# Patient Record
Sex: Male | Born: 1970
Health system: Southern US, Community
[De-identification: ages and names within clinical notes are randomized; demographics above are authoritative.]

## PROBLEM LIST (undated history)

## (undated) DIAGNOSIS — I10 Essential (primary) hypertension: Secondary | ICD-10-CM

## (undated) DIAGNOSIS — J45909 Unspecified asthma, uncomplicated: Secondary | ICD-10-CM

## (undated) DIAGNOSIS — K649 Unspecified hemorrhoids: Secondary | ICD-10-CM

---

## 2013-09-14 ENCOUNTER — Emergency Department (HOSPITAL_BASED_OUTPATIENT_CLINIC_OR_DEPARTMENT_OTHER)
Admission: EM | Admit: 2013-09-14 | Discharge: 2013-09-14 | Disposition: A | Discharge status: Home / Self Care | Payer: Self-pay | Attending: Emergency Medicine | Admitting: Emergency Medicine

## 2013-09-14 ENCOUNTER — Encounter (HOSPITAL_BASED_OUTPATIENT_CLINIC_OR_DEPARTMENT_OTHER): Payer: Self-pay | Admitting: Emergency Medicine

## 2013-09-14 DIAGNOSIS — Y92009 Unspecified place in unspecified non-institutional (private) residence as the place of occurrence of the external cause: Secondary | ICD-10-CM | POA: Insufficient documentation

## 2013-09-14 DIAGNOSIS — T1590XA Foreign body on external eye, part unspecified, unspecified eye, initial encounter: Secondary | ICD-10-CM | POA: Insufficient documentation

## 2013-09-14 DIAGNOSIS — Y9389 Activity, other specified: Secondary | ICD-10-CM | POA: Insufficient documentation

## 2013-09-14 DIAGNOSIS — Z880 Allergy status to penicillin: Secondary | ICD-10-CM | POA: Insufficient documentation

## 2013-09-14 DIAGNOSIS — H109 Unspecified conjunctivitis: Secondary | ICD-10-CM | POA: Insufficient documentation

## 2013-09-14 DIAGNOSIS — F17200 Nicotine dependence, unspecified, uncomplicated: Secondary | ICD-10-CM | POA: Insufficient documentation

## 2013-09-14 MED ORDER — POLYETHYL GLYCOL-PROPYL GLYCOL 0.4-0.3 % OP SOLN: 1.0000 [drp] | Freq: Four times a day (QID) | OPHTHALMIC | Status: DC | PRN

## 2013-09-14 MED ORDER — FLUORESCEIN SODIUM 1 MG OP STRP
1.0000 | ORAL_STRIP | Freq: Once | OPHTHALMIC | Status: AC
  Administered 2013-09-14: 1 via OPHTHALMIC

## 2013-09-14 MED ORDER — FLUORESCEIN SODIUM 1 MG OP STRP
ORAL_STRIP | OPHTHALMIC | Status: AC
  Administered 2013-09-14: 1 via OPHTHALMIC
  Filled 2013-09-14: qty 1

## 2013-09-14 MED ORDER — TETRACAINE HCL 0.5 % OP SOLN
OPHTHALMIC | Status: AC
  Administered 2013-09-14: 1 [drp] via OPHTHALMIC
  Filled 2013-09-14: qty 2

## 2013-09-14 MED ORDER — TETRACAINE HCL 0.5 % OP SOLN
1.0000 [drp] | Freq: Once | OPHTHALMIC | Status: AC
  Administered 2013-09-14: 1 [drp] via OPHTHALMIC

## 2013-09-14 NOTE — ED Notes (Signed)
Pt having left eye irritation x 2 days.  Pt states something was in his eye yesterday and still feels like it is present.  Noted redness and irritation.  No blurry vision at this time.

## 2013-09-14 NOTE — ED Provider Notes (Signed)
I saw and evaluated the patient, reviewed the resident's note and I agree with the findings and plan.  EKG Interpretation   None       Pt is a 42 y.o. male with no significant past medical history presents emergency department with left eye irritation that started yesterday. He states that on Friday, 3 days ago he was working and got dust in his eye. He did not notice any discomfort until yesterday and his eye became red and began watering. No purulent discharge. No loss of vision, blurry vision. No headache or vomiting. No other history of trauma to the eye. On exam, patient has conjunctival irritation of both eyes, worse on the left. Extraocular movements intact and painless. Pupils are equal reactive bilaterally. No pain with consensual light response. Visual fields normal. Vision is 20/20 in both eyes. No corneal abrasion seen with fluorescein staining.  Negative Siedel's.  Patient did have 0.5 mm papule to his left lower eyelid on the inside of the eyelid. I was able to express this lesion using a Q-tip and there was a small amount of sebaceous-like drainage. After this lesion had resolved, patient reports his symptoms have completely resolved. No other foreign body appreciated with a version of eyelids. Patient likely has allergic conjunctivitis. No concern for globe injury, glaucoma, retinal detachment.  We'll discharge home with Artificial Tears, ophthalmology followup as needed, return precautions. Patient verbalized understanding and is comfortable plan.  Layla Maw Ward, DO 09/14/13 1106

## 2013-09-14 NOTE — Discharge Instructions (Signed)

## 2013-09-14 NOTE — ED Provider Notes (Signed)
CSN: [id number]     Arrival date & time 09/14/13  1011 History   First MD Initiated Contact with Patient 09/14/13 1018     Chief Complaint  Patient presents with  . Foreign Body in Eye   (Consider location/radiation/quality/duration/timing/severity/associated sxs/prior Treatment) Patient is a 42 y.o. male presenting with foreign body in eye. The history is provided by the patient.  Foreign Body in Eye This is a new problem. The current episode started in the past 7 days. The problem occurs constantly. The problem has been unchanged. Pertinent negatives include no abdominal pain, chills, coughing, diaphoresis, fatigue, fever, headaches, neck pain, rash, sore throat or vomiting. Nothing aggravates the symptoms. He has tried nothing for the symptoms. The treatment provided no relief.   Patient reports that two days ago he was working at home when he got dust in his eye. Last night he started to have an itching sensation and tearing. He denies visual disturbance, glare, blurry vision. He has no photophobia. No eyelid swelling or pain.  History reviewed. No pertinent past medical history. History reviewed. No pertinent past surgical history. History reviewed. No pertinent family history. History  Substance Use Topics  . Smoking status: Current Some Day Smoker    Types: Cigars  . Smokeless tobacco: Not on file  . Alcohol Use: Not on file    Review of Systems  Constitutional: Negative for fever, chills, diaphoresis and fatigue.  HENT: Negative for sore throat.   Respiratory: Negative for cough.   Gastrointestinal: Negative for vomiting and abdominal pain.  Musculoskeletal: Negative for neck pain.  Skin: Negative for rash.  Neurological: Negative for headaches.    Allergies  Penicillins  Home Medications  No current outpatient prescriptions on file. BP 146/84  Pulse 65  Temp(Src) 97.7 F (36.5 C) (Oral)  Resp 20  Ht 6' 3.5" (1.918 m)  Wt 195 lb (88.451 kg)  BMI 24.04 kg/m2   SpO2 100% Physical Exam  Eyes: Pupils are equal, round, and reactive to light. Lids are everted and swept, no foreign bodies found. Right eye exhibits no discharge, no exudate and no hordeolum. No foreign body present in the right eye. Left eye exhibits discharge. Left eye exhibits no chemosis, no exudate and no hordeolum. No foreign body present in the left eye. Right conjunctiva is not injected. Right conjunctiva has no hemorrhage. Left conjunctiva is injected. Left conjunctiva has no hemorrhage. No scleral icterus. Right eye exhibits normal extraocular motion and no nystagmus. Left eye exhibits normal extraocular motion and no nystagmus. Right pupil is round and reactive. Left pupil is round and reactive. Pupils are equal.  Slit lamp exam:      The right eye shows no corneal abrasion, no corneal flare, no corneal ulcer, no foreign body, no hyphema and no hypopyon.       The left eye shows no corneal abrasion, no corneal flare, no corneal ulcer, no foreign body, no hyphema, no hypopyon and no fluorescein uptake.    Small yellow spot on inside of lower eyelid approximately 1.5 mm from the margin and about .5 mm diameter.    ED Course  Procedures (including critical care time) Labs Review Labs Reviewed - No data to display Imaging Review No results found.  EKG Interpretation   None       MDM   1. Conjunctivitis of left eye     1. Conjunctivitis The patients eye itchiness and tearing appear to be secondary to be allergic conjunctivitis that had resutled after dust exposure (  foreign body). There is a 0.5 mm yellow pustule on the inside of the lower lid that was cleared by cue tip with much improvement in the patients symptoms. Recommend patient to use preservative free artificial tears qid prn. Recommend the patient f/u with eye doctor if new or worsening symptoms.    Pleas Koch, MD 09/14/13 1115

## 2014-07-12 ENCOUNTER — Encounter (HOSPITAL_BASED_OUTPATIENT_CLINIC_OR_DEPARTMENT_OTHER): Payer: Self-pay | Admitting: Emergency Medicine

## 2014-07-12 ENCOUNTER — Emergency Department (HOSPITAL_BASED_OUTPATIENT_CLINIC_OR_DEPARTMENT_OTHER)
Admission: EM | Admit: 2014-07-12 | Discharge: 2014-07-13 | Disposition: A | Discharge status: Home / Self Care | Payer: No Typology Code available for payment source | Attending: Emergency Medicine | Admitting: Emergency Medicine

## 2014-07-12 DIAGNOSIS — F17200 Nicotine dependence, unspecified, uncomplicated: Secondary | ICD-10-CM | POA: Insufficient documentation

## 2014-07-12 DIAGNOSIS — S161XXA Strain of muscle, fascia and tendon at neck level, initial encounter: Secondary | ICD-10-CM

## 2014-07-12 DIAGNOSIS — S139XXA Sprain of joints and ligaments of unspecified parts of neck, initial encounter: Secondary | ICD-10-CM | POA: Diagnosis not present

## 2014-07-12 DIAGNOSIS — Y92410 Unspecified street and highway as the place of occurrence of the external cause: Secondary | ICD-10-CM | POA: Insufficient documentation

## 2014-07-12 DIAGNOSIS — Z880 Allergy status to penicillin: Secondary | ICD-10-CM | POA: Diagnosis not present

## 2014-07-12 DIAGNOSIS — S199XXA Unspecified injury of neck, initial encounter: Secondary | ICD-10-CM | POA: Diagnosis present

## 2014-07-12 DIAGNOSIS — S0993XA Unspecified injury of face, initial encounter: Secondary | ICD-10-CM | POA: Diagnosis present

## 2014-07-12 DIAGNOSIS — Y9389 Activity, other specified: Secondary | ICD-10-CM | POA: Insufficient documentation
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
## 2014-07-13 ENCOUNTER — Emergency Department (HOSPITAL_BASED_OUTPATIENT_CLINIC_OR_DEPARTMENT_OTHER): Payer: No Typology Code available for payment source

## 2014-07-13 MED ORDER — HYDROCODONE-ACETAMINOPHEN 5-325 MG PO TABS: 1.0000 | ORAL_TABLET | Freq: Four times a day (QID) | ORAL | Status: DC | PRN

## 2014-07-13 MED ORDER — CYCLOBENZAPRINE HCL 10 MG PO TABS: 10.0000 mg | ORAL_TABLET | Freq: Three times a day (TID) | ORAL | Status: DC | PRN

## 2015-02-26 ENCOUNTER — Encounter (HOSPITAL_BASED_OUTPATIENT_CLINIC_OR_DEPARTMENT_OTHER): Payer: Self-pay | Admitting: *Deleted

## 2015-02-26 ENCOUNTER — Emergency Department (HOSPITAL_BASED_OUTPATIENT_CLINIC_OR_DEPARTMENT_OTHER)
Admission: EM | Admit: 2015-02-26 | Discharge: 2015-02-26 | Disposition: A | Discharge status: Home / Self Care | Payer: 59 | Attending: Emergency Medicine | Admitting: Emergency Medicine

## 2015-02-26 DIAGNOSIS — T148 Other injury of unspecified body region: Secondary | ICD-10-CM | POA: Insufficient documentation

## 2015-02-26 DIAGNOSIS — S4992XA Unspecified injury of left shoulder and upper arm, initial encounter: Secondary | ICD-10-CM | POA: Insufficient documentation

## 2015-02-26 DIAGNOSIS — S3992XA Unspecified injury of lower back, initial encounter: Secondary | ICD-10-CM | POA: Diagnosis not present

## 2015-02-26 DIAGNOSIS — S0990XA Unspecified injury of head, initial encounter: Secondary | ICD-10-CM | POA: Diagnosis present

## 2015-02-26 DIAGNOSIS — T148XXA Other injury of unspecified body region, initial encounter: Secondary | ICD-10-CM

## 2015-02-26 DIAGNOSIS — Z720 Tobacco use: Secondary | ICD-10-CM | POA: Diagnosis not present

## 2015-02-26 DIAGNOSIS — Y9389 Activity, other specified: Secondary | ICD-10-CM | POA: Diagnosis not present

## 2015-02-26 DIAGNOSIS — S199XXA Unspecified injury of neck, initial encounter: Secondary | ICD-10-CM | POA: Insufficient documentation

## 2015-02-26 DIAGNOSIS — Z880 Allergy status to penicillin: Secondary | ICD-10-CM | POA: Diagnosis not present

## 2015-02-26 DIAGNOSIS — Y998 Other external cause status: Secondary | ICD-10-CM | POA: Insufficient documentation

## 2015-02-26 DIAGNOSIS — Y92410 Unspecified street and highway as the place of occurrence of the external cause: Secondary | ICD-10-CM | POA: Insufficient documentation

## 2015-02-26 MED ORDER — NAPROXEN 500 MG PO TABS: 500.0000 mg | ORAL_TABLET | Freq: Two times a day (BID) | ORAL | Status: DC

## 2015-02-26 MED ORDER — IBUPROFEN 800 MG PO TABS
800.0000 mg | ORAL_TABLET | Freq: Once | ORAL | Status: AC
  Administered 2015-02-26: 800 mg via ORAL
  Filled 2015-02-26: qty 1

## 2015-02-26 MED ORDER — CYCLOBENZAPRINE HCL 10 MG PO TABS
10.0000 mg | ORAL_TABLET | Freq: Once | ORAL | Status: AC
  Administered 2015-02-26: 10 mg via ORAL
  Filled 2015-02-26: qty 1

## 2015-02-26 MED ORDER — CYCLOBENZAPRINE HCL 10 MG PO TABS: 10.0000 mg | ORAL_TABLET | Freq: Two times a day (BID) | ORAL | Status: DC | PRN
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
## 2015-02-26 NOTE — Discharge Instructions (Signed)
Contusion °A contusion is a deep bruise. Contusions happen when an injury causes bleeding under the skin. Signs of bruising include pain, puffiness (swelling), and discolored skin. The contusion may turn blue, purple, or yellow. °HOME CARE  °· Put ice on the injured area. °¨ Put ice in a plastic bag. °¨ Place a towel between your skin and the bag. °¨ Leave the ice on for 15-20 minutes, 03-04 times a day. °· Only take medicine as told by your doctor. °· Rest the injured area. °· If possible, raise (elevate) the injured area to lessen puffiness. °GET HELP RIGHT AWAY IF:  °· You have more bruising or puffiness. °· You have pain that is getting worse. °· Your puffiness or pain is not helped by medicine. °MAKE SURE YOU:  °· Understand these instructions. °· Will watch your condition. °· Will get help right away if you are not doing well or get worse. °Document Released: 04/10/2008 Document Revised: 01/15/2012 Document Reviewed: 08/28/2011 °[employer]® Patient Information ©2015 [employer]. This information is not intended to replace advice given to you by your health care provider. Make sure you discuss any questions you have with your health care provider. ° °
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
## 2015-04-07 ENCOUNTER — Telehealth (HOSPITAL_BASED_OUTPATIENT_CLINIC_OR_DEPARTMENT_OTHER): Payer: Self-pay

## 2015-04-07 ENCOUNTER — Encounter (HOSPITAL_BASED_OUTPATIENT_CLINIC_OR_DEPARTMENT_OTHER): Payer: Self-pay | Admitting: Emergency Medicine

## 2015-04-07 ENCOUNTER — Telehealth: Payer: Self-pay | Admitting: Emergency Medicine

## 2015-04-07 ENCOUNTER — Emergency Department (HOSPITAL_BASED_OUTPATIENT_CLINIC_OR_DEPARTMENT_OTHER)
Admission: EM | Admit: 2015-04-07 | Discharge: 2015-04-07 | Disposition: A | Discharge status: Home / Self Care | Payer: 59 | Attending: Emergency Medicine | Admitting: Emergency Medicine

## 2015-04-07 DIAGNOSIS — Z791 Long term (current) use of non-steroidal anti-inflammatories (NSAID): Secondary | ICD-10-CM | POA: Diagnosis not present

## 2015-04-07 DIAGNOSIS — K6289 Other specified diseases of anus and rectum: Secondary | ICD-10-CM | POA: Diagnosis present

## 2015-04-07 DIAGNOSIS — Z79899 Other long term (current) drug therapy: Secondary | ICD-10-CM | POA: Diagnosis not present

## 2015-04-07 DIAGNOSIS — K642 Third degree hemorrhoids: Secondary | ICD-10-CM | POA: Diagnosis not present

## 2015-04-07 DIAGNOSIS — Z880 Allergy status to penicillin: Secondary | ICD-10-CM | POA: Insufficient documentation

## 2015-04-07 DIAGNOSIS — Z720 Tobacco use: Secondary | ICD-10-CM | POA: Insufficient documentation

## 2015-04-07 MED ORDER — HYDROCODONE-ACETAMINOPHEN 5-325 MG PO TABS: 1.0000 | ORAL_TABLET | Freq: Four times a day (QID) | ORAL | Status: DC | PRN

## 2015-04-07 MED ORDER — LIDOCAINE (ANORECTAL) 5 % EX GEL: CUTANEOUS | Status: DC

## 2015-04-07 MED ORDER — HYDROCORTISONE 2.5 % RE CREA: 1.0000 "application " | TOPICAL_CREAM | Freq: Two times a day (BID) | RECTAL | Status: DC

## 2015-04-07 MED ORDER — LIDOCAINE HCL 2 % EX GEL
CUTANEOUS | Status: AC
  Administered 2015-04-07: 20
  Filled 2015-04-07: qty 20

## 2015-04-07 NOTE — ED Notes (Signed)
Patient states that he feels like his Hemorid are so enlarged now that they will not go back up in

## 2015-04-07 NOTE — Discharge Instructions (Signed)

## 2015-04-07 NOTE — ED Provider Notes (Signed)
CSN: [id number]     Arrival date & time 04/07/15  0218 History   First MD Initiated Contact with Patient 04/07/15 0547     Chief Complaint  Patient presents with  . Rectal Pain     (Consider location/radiation/quality/duration/timing/severity/associated sxs/prior Treatment) HPI  This is a 44 year old male with a long-standing history of hemorrhoids. His hemorrhoids have worsened over the past several days and are no longer reducible. He is aware of 6 hemorrhoids present. Pain is moderate to severe and exacerbated by bowel movements or by sitting. He took 2 Vicodin earlier with significant relief in his pain. He denies abdominal pain, nausea or vomiting.  History reviewed. No pertinent past medical history. History reviewed. No pertinent past surgical history. History reviewed. No pertinent family history. History  Substance Use Topics  . Smoking status: Current Some Day Smoker    Types: Cigars  . Smokeless tobacco: Not on file  . Alcohol Use: No    Review of Systems  All other systems reviewed and are negative.   Allergies  Penicillins  Home Medications   Prior to Admission medications   Medication Sig Start Date End Date Taking? Authorizing Provider  cyclobenzaprine (FLEXERIL) 10 MG tablet Take 1 tablet (10 mg total) by mouth 2 (two) times daily as needed for muscle spasms. 02/26/15   Gwyneth SproutWhitney Plunkett, MD  HYDROcodone-acetaminophen (NORCO) 5-325 MG per tablet Take 1-2 tablets by mouth every 6 (six) hours as needed (for pain). 07/13/14   Taray Normoyle, MD  naproxen (NAPROSYN) 500 MG tablet Take 1 tablet (500 mg total) by mouth 2 (two) times daily. 02/26/15   Gwyneth SproutWhitney Plunkett, MD  Polyethyl Glycol-Propyl Glycol (SYSTANE PRESERVATIVE FREE) 0.4-0.3 % SOLN Apply 1 drop to eye 4 (four) times daily as needed. 09/14/13   Bobbye Charlestonhristopher B Komanski, MD   BP 130/66 mmHg  Pulse 50  Temp(Src) 97.8 F (36.6 C) (Oral)  Resp 18  Ht 6\' 3"  (1.905 m)  Wt 195 lb (88.451 kg)  BMI 24.37 kg/m2  SpO2  99%   Physical Exam  General: Well-developed, well-nourished male in no acute distress; appearance consistent with age of record HENT: normocephalic; atraumatic Eyes: pupils equal, round and reactive to light; extraocular muscles intact Neck: supple Heart: regular rate and rhythm Lungs: clear to auscultation bilaterally Abdomen: soft; nondistended; nontender; bowel sounds present Rectal: Large, tender, nonreducible external hemorrhoids:   Extremities: No deformity; full range of motion; pulses normal Neurologic: Awake, alert and oriented; motor function intact in all extremities and symmetric; no facial droop Skin: Warm and dry Psychiatric: Normal mood and affect    ED Course  Procedures (including critical care time)   MDM  Patient and family were advised that these hemorrhoids are severe enough to be outside the scope of treatment in the ED. We will treat him symptomatically and refer to [facility] for definitive treatment.  Paula LibraJohn Khalila Buechner, MD 04/07/15 [phone]

## 2015-12-26 ENCOUNTER — Encounter (HOSPITAL_BASED_OUTPATIENT_CLINIC_OR_DEPARTMENT_OTHER): Payer: Self-pay | Admitting: *Deleted

## 2015-12-26 ENCOUNTER — Emergency Department (HOSPITAL_BASED_OUTPATIENT_CLINIC_OR_DEPARTMENT_OTHER)
Admission: EM | Admit: 2015-12-26 | Discharge: 2015-12-26 | Disposition: A | Discharge status: Home / Self Care | Payer: No Typology Code available for payment source | Attending: Emergency Medicine | Admitting: Emergency Medicine

## 2015-12-26 DIAGNOSIS — K649 Unspecified hemorrhoids: Secondary | ICD-10-CM

## 2015-12-26 DIAGNOSIS — Z87891 Personal history of nicotine dependence: Secondary | ICD-10-CM | POA: Insufficient documentation

## 2015-12-26 DIAGNOSIS — Z880 Allergy status to penicillin: Secondary | ICD-10-CM | POA: Insufficient documentation

## 2015-12-26 DIAGNOSIS — K648 Other hemorrhoids: Secondary | ICD-10-CM | POA: Insufficient documentation

## 2015-12-26 HISTORY — DX: Unspecified hemorrhoids: K64.9

## 2015-12-26 MED ORDER — HYDROCORTISONE 2.5 % RE CREA: TOPICAL_CREAM | RECTAL | Status: DC

## 2015-12-26 MED ORDER — HYDROCODONE-ACETAMINOPHEN 5-325 MG PO TABS: ORAL_TABLET | ORAL | Status: DC

## 2015-12-26 MED ORDER — LIDOCAINE (ANORECTAL) 5 % EX GEL: CUTANEOUS | Status: DC

## 2015-12-26 NOTE — Discharge Instructions (Signed)
Please read and follow all provided instructions.  Your diagnoses today include:  1. Acute hemorrhoid    Tests performed today include:  Vital signs. See below for your results today.   Medications prescribed:   Vicodin (hydrocodone/acetaminophen) - narcotic pain medication  DO NOT drive or perform any activities that require you to be awake and alert because this medicine can make you drowsy. BE VERY CAREFUL not to take multiple medicines containing Tylenol (also called acetaminophen). Doing so can lead to an overdose which can damage your liver and cause liver failure and possibly death.  Take any prescribed medications only as directed.  Home care instructions:  Follow any educational materials contained in this packet.  BE VERY CAREFUL not to take multiple medicines containing Tylenol (also called acetaminophen). Doing so can lead to an overdose which can damage your liver and cause liver failure and possibly death.   Follow-up instructions: Please follow-up with your surgeon in the next 1 week for further evaluation of your symptoms.   Return instructions:   Please return to the Emergency Department if you experience worsening symptoms.   Please return if you have any other emergent concerns.  Additional Information:  Your vital signs today were: BP 140/80 mmHg   Pulse 62   Temp(Src) 98.1 F (36.7 C) (Oral)   Resp 16   Ht  (1.905 m)   Wt 88.451 kg   BMI 24.37 kg/m2   SpO2 99% If your blood pressure (BP) was elevated above 135/85 this visit, please have this repeated by your doctor within one month. --------------

## 2015-12-26 NOTE — ED Notes (Signed)
Pt reports hx of hemorrhoids; has had a painful flare-up since yesterday.

## 2015-12-26 NOTE — ED Provider Notes (Signed)
CSN: [id number]     Arrival date & time 12/26/15  1645 History   First MD Initiated Contact with Patient 12/26/15 1652     Chief Complaint  Patient presents with  . Hemorrhoids     (Consider location/radiation/quality/duration/timing/severity/associated sxs/prior Treatment) HPI Comments: Patient with history of hemorrhoids presents with worsening of hemorrhoid pain since riding in a car yesterday. Patient has seen a surgeon in [facility] for these but has not had any procedures done to this point. He has noted bleeding and soreness. No fevers or drainage. No treatments prior to arrival. Patient has been seen in the ED for the same in the past.  The history is provided by the patient and medical records.    Past Medical History  Diagnosis Date  . Hemorrhoids    History reviewed. No pertinent past surgical history. No family history on file. Social History  Substance Use Topics  . Smoking status: Former Smoker    Types: Cigars    Quit date: 12/19/2015  . Smokeless tobacco: Never Used  . Alcohol Use: No    Review of Systems  Constitutional: Negative for fever.  HENT: Negative for rhinorrhea and sore throat.   Eyes: Negative for redness.  Respiratory: Negative for cough.   Cardiovascular: Negative for chest pain.  Gastrointestinal: Positive for blood in stool and rectal pain. Negative for nausea, vomiting, abdominal pain and diarrhea.  Genitourinary: Negative for dysuria.  Musculoskeletal: Negative for myalgias.  Skin: Negative for rash.  Neurological: Negative for headaches.      Allergies  Penicillins  Home Medications   Prior to Admission medications   Medication Sig Start Date End Date Taking? Authorizing Provider  HYDROcodone-acetaminophen (NORCO/VICODIN) 5-325 MG tablet Take 1-2 tablets every 6 hours as needed for severe pain 12/26/15   Renne Crigler, PA-C  hydrocortisone (ANUSOL-HC) 2.5 % rectal cream Apply rectally 2 times daily 12/26/15   Renne Crigler, PA-C   Lidocaine, Anorectal, 5 % GEL Apply topically twice a day 12/26/15   Renne Crigler, PA-C   BP 140/80 mmHg  Pulse 62  Temp(Src) 98.1 F (36.7 C) (Oral)  Resp 16  Ht  (1.905 m)  Wt 88.451 kg  BMI 24.37 kg/m2  SpO2 99% Physical Exam  Constitutional: He appears well-developed and well-nourished.  HENT:  Head: Normocephalic and atraumatic.  Eyes: Conjunctivae are normal. Right eye exhibits no discharge. Left eye exhibits no discharge.  Neck: Normal range of motion. Neck supple.  Cardiovascular: Normal rate, regular rhythm and normal heart sounds.   Pulmonary/Chest: Effort normal and breath sounds normal.  Abdominal: Soft. There is no tenderness.  Genitourinary:  Multiple extruding hemorrhoids, all appear non-thrombosed but are very raw with some mild active oozing of blood. No associated soft tissue infection.   Neurological: He is alert.  Skin: Skin is warm and dry.  Psychiatric: He has a normal mood and affect.  Nursing note and vitals reviewed.   ED Course  Procedures (including critical care time) Labs Review Labs Reviewed - No data to display  Imaging Review No results found. I have personally reviewed and evaluated these images and lab results as part of my medical decision-making.   EKG Interpretation None       5:11 PM Patient seen and examined.    Vital signs reviewed and are as follows: BP 140/80 mmHg  Pulse 62  Temp(Src) 98.1 F (36.7 C) (Oral)  Resp 16  Ht  (1.905 m)  Wt 88.451 kg  BMI 24.37 kg/m2  SpO2  99%  Counseled on sitz baths. Will treat supportively. Patient encouraged to follow-up with his surgeon in [facility] for definitive treatment.  Patient counseled on use of narcotic pain medications. Counseled not to combine these medications with others containing tylenol. Urged not to drink alcohol, drive, or perform any other activities that requires focus while taking these medications. The patient verbalizes understanding and agrees with  the plan.   MDM   Final diagnoses:  Acute hemorrhoid   Patient with swelling and pain from chronic hemorrhoids. No indication for I&D at this time. No infection is suspected.    Renne Crigler, PA-C 12/26/15 1712  Jerelyn Scott, MD 12/26/15 [phone]

## 2018-06-27 ENCOUNTER — Other Ambulatory Visit: Payer: Self-pay

## 2018-06-27 ENCOUNTER — Emergency Department (HOSPITAL_BASED_OUTPATIENT_CLINIC_OR_DEPARTMENT_OTHER)
Admission: EM | Admit: 2018-06-27 | Discharge: 2018-06-27 | Disposition: A | Discharge status: Home / Self Care | Payer: Self-pay | Attending: Emergency Medicine | Admitting: Emergency Medicine

## 2018-06-27 ENCOUNTER — Encounter (HOSPITAL_BASED_OUTPATIENT_CLINIC_OR_DEPARTMENT_OTHER): Payer: Self-pay

## 2018-06-27 DIAGNOSIS — R197 Diarrhea, unspecified: Secondary | ICD-10-CM | POA: Insufficient documentation

## 2018-06-27 DIAGNOSIS — Z87891 Personal history of nicotine dependence: Secondary | ICD-10-CM | POA: Insufficient documentation

## 2018-06-27 LAB — CBC WITH DIFFERENTIAL/PLATELET
Basophils Absolute: 0 10*3/uL (ref 0.0–0.1)
Basophils Relative: 0 %
EOS PCT: 5 %
Eosinophils Absolute: 0.3 10*3/uL (ref 0.0–0.7)
HCT: 41 % (ref 39.0–52.0)
HEMOGLOBIN: 14.6 g/dL (ref 13.0–17.0)
Lymphocytes Relative: 26 %
Lymphs Abs: 1.6 10*3/uL (ref 0.7–4.0)
MCH: 29.4 pg (ref 26.0–34.0)
MCHC: 35.6 g/dL (ref 30.0–36.0)
MCV: 82.5 fL (ref 78.0–100.0)
MONOS PCT: 12 %
Monocytes Absolute: 0.7 10*3/uL (ref 0.1–1.0)
NEUTROS PCT: 57 %
Neutro Abs: 3.4 10*3/uL (ref 1.7–7.7)
Platelets: 256 10*3/uL (ref 150–400)
RBC: 4.97 MIL/uL (ref 4.22–5.81)
RDW: 12.8 % (ref 11.5–15.5)
WBC: 6 10*3/uL (ref 4.0–10.5)

## 2018-06-27 LAB — COMPREHENSIVE METABOLIC PANEL
ALT: 11 U/L (ref 0–44)
AST: 20 U/L (ref 15–41)
Albumin: 4 g/dL (ref 3.5–5.0)
Alkaline Phosphatase: 83 U/L (ref 38–126)
Anion gap: 9 (ref 5–15)
BILIRUBIN TOTAL: 1.2 mg/dL (ref 0.3–1.2)
BUN: 12 mg/dL (ref 6–20)
CO2: 25 mmol/L (ref 22–32)
CREATININE: 1 mg/dL (ref 0.61–1.24)
Calcium: 8.8 mg/dL — ABNORMAL LOW (ref 8.9–10.3)
Chloride: 103 mmol/L (ref 98–111)
GFR calc Af Amer: >60 mL/min (ref >60)
Glucose, Bld: 95 mg/dL (ref 70–99)
Potassium: 3.4 mmol/L — ABNORMAL LOW (ref 3.5–5.1)
Sodium: 137 mmol/L (ref 135–145)
TOTAL PROTEIN: 7.6 g/dL (ref 6.5–8.1)

## 2018-06-27 LAB — LIPASE, BLOOD: Lipase: 26 U/L (ref 11–51)

## 2018-06-27 MED ORDER — POTASSIUM CHLORIDE CRYS ER 20 MEQ PO TBCR: 40.0000 meq | EXTENDED_RELEASE_TABLET | Freq: Once | ORAL | Status: DC

## 2018-06-27 NOTE — ED Triage Notes (Signed)
Pt reports intermittent abdominal pain x 1 week. Nausea today. Sts diarrhea as well. No pain at this time.

## 2018-06-27 NOTE — ED Provider Notes (Signed)
[facility] EMERGENCY DEPARTMENT Provider Note   CSN: [id number] Arrival date & time: 06/27/18  2056     History   Chief Complaint Chief Complaint  Patient presents with  . Diarrhea    HPI Adam May is a 47 y.o. male.  47 yo M with a cc of diarrhea.  Going on for the past week.  Denies fevers or chills.  Denies dark or bloody stool.  Denies nausea or vomiting.  Unsure of sick contacts. Denies suspicious food intake or recent travel.  Transient abdominal pain, now resolved.   The history is provided by the patient.  Diarrhea   This is a new problem. The current episode started more than 1 week ago. The problem occurs 2 to 4 times per day. The problem has not changed since onset.There has been no fever. Pertinent negatives include no abdominal pain, no vomiting, no chills, no headaches, no arthralgias and no myalgias.    Past Medical History:  Diagnosis Date  . Hemorrhoids     There are no active problems to display for this patient.   History reviewed. No pertinent surgical history.      Home Medications    Prior to Admission medications   Medication Sig Start Date End Date Taking? Authorizing Provider  HYDROcodone-acetaminophen (NORCO/VICODIN) 5-325 MG tablet Take 1-2 tablets every 6 hours as needed for severe pain 12/26/15   Renne Crigler, PA-C  hydrocortisone (ANUSOL-HC) 2.5 % rectal cream Apply rectally 2 times daily 12/26/15   Renne Crigler, PA-C  Lidocaine, Anorectal, 5 % GEL Apply topically twice a day 12/26/15   Renne Crigler, PA-C    Family History No family history on file.  Social History Social History   Tobacco Use  . Smoking status: Former Smoker    Types: Cigars    Last attempt to quit: 12/19/2015    Years since quitting: 2.5  . Smokeless tobacco: Never Used  Substance Use Topics  . Alcohol use: No  . Drug use: No     Allergies   Penicillins   Review of Systems Review of Systems  Constitutional: Negative for chills  and fever.  HENT: Negative for congestion and facial swelling.   Eyes: Negative for discharge and visual disturbance.  Respiratory: Negative for shortness of breath.   Cardiovascular: Negative for chest pain and palpitations.  Gastrointestinal: Positive for diarrhea. Negative for abdominal pain and vomiting.  Musculoskeletal: Negative for arthralgias and myalgias.  Skin: Negative for color change and rash.  Neurological: Negative for tremors, syncope and headaches.  Psychiatric/Behavioral: Negative for confusion and dysphoric mood.     Physical Exam Updated Vital Signs BP (!) 119/91 (BP Location: Left Arm)   Pulse 89   Temp (!) 97.5 F (36.4 C) (Oral)   Resp 16   Ht 6\' 3"  (1.905 m)   Wt 88.5 kg   SpO2 98%   BMI 24.37 kg/m   Physical Exam  Constitutional: He is oriented to person, place, and time. He appears well-developed and well-nourished.  HENT:  Head: Normocephalic and atraumatic.  Eyes: Pupils are equal, round, and reactive to light. EOM are normal.  Neck: Normal range of motion. Neck supple. No JVD present.  Cardiovascular: Normal rate and regular rhythm. Exam reveals no gallop and no friction rub.  No murmur heard. Pulmonary/Chest: No respiratory distress. He has no wheezes.  Abdominal: He exhibits no distension and no mass. There is no tenderness. There is no rebound and no guarding.  Musculoskeletal: Normal range of motion.  Neurological: He is alert and oriented to person, place, and time.  Skin: No rash noted. No pallor.  Psychiatric: He has a normal mood and affect. His behavior is normal.  Nursing note and vitals reviewed.    ED Treatments / Results  Labs (all labs ordered are listed, but only abnormal results are displayed) Labs Reviewed  COMPREHENSIVE METABOLIC PANEL - Abnormal; Notable for the following components:      Result Value   Potassium 3.4 (*)    Calcium 8.8 (*)    All other components within normal limits  CBC WITH DIFFERENTIAL/PLATELET    LIPASE, BLOOD    EKG None  Radiology No results found.  Procedures Procedures (including critical care time)  Medications Ordered in ED Medications  potassium chloride SA (K-DUR,KLOR-CON) CR tablet 40 mEq (40 mEq Oral Refused 06/27/18 2301)     Initial Impression / Assessment and Plan / ED Course  I have reviewed the triage vital signs and the nursing notes.  Pertinent labs & imaging results that were available during my care of the patient were reviewed by me and considered in my medical decision making (see chart for details).     47 yo M with a cc of diarrhea.  Labs here with very mild hypokalemia.  Well appearing non toxic.  D/c home.   11:18 PM:  I have discussed the diagnosis/risks/treatment options with the patient and family and believe the pt to be eligible for discharge home to follow-up with PCP. We also discussed returning to the ED immediately if new or worsening sx occur. We discussed the sx which are most concerning (e.g., sudden worsening pain, fever, inability to tolerate by mouth) that necessitate immediate return. Medications administered to the patient during their visit and any new prescriptions provided to the patient are listed below.  Medications given during this visit Medications  potassium chloride SA (K-DUR,KLOR-CON) CR tablet 40 mEq (40 mEq Oral Refused 06/27/18 2301)     The patient appears reasonably screen and/or stabilized for discharge and I doubt any other medical condition or other [facility] requiring further screening, evaluation, or treatment in the ED at this time prior to discharge.    Final Clinical Impressions(s) / ED Diagnoses   Final diagnoses:  Diarrhea, unspecified type    ED Discharge Orders    None       Melene PlanFloyd, Pollyann Roa, DO 06/27/18 2318

## 2018-06-27 NOTE — Discharge Instructions (Signed)
Try imodium.  Return for worsening pain, fever, inability to eat or drink.

## 2019-06-04 ENCOUNTER — Encounter (HOSPITAL_BASED_OUTPATIENT_CLINIC_OR_DEPARTMENT_OTHER): Payer: Self-pay

## 2019-06-04 ENCOUNTER — Emergency Department (HOSPITAL_BASED_OUTPATIENT_CLINIC_OR_DEPARTMENT_OTHER)
Admission: EM | Admit: 2019-06-04 | Discharge: 2019-06-04 | Disposition: A | Discharge status: Home / Self Care | Payer: Self-pay | Attending: Emergency Medicine | Admitting: Emergency Medicine

## 2019-06-04 ENCOUNTER — Other Ambulatory Visit: Payer: Self-pay

## 2019-06-04 DIAGNOSIS — R062 Wheezing: Secondary | ICD-10-CM | POA: Insufficient documentation

## 2019-06-04 DIAGNOSIS — Z87891 Personal history of nicotine dependence: Secondary | ICD-10-CM | POA: Insufficient documentation

## 2019-06-04 MED ORDER — IPRATROPIUM-ALBUTEROL 0.5-2.5 (3) MG/3ML IN SOLN: 3.0000 mL | Freq: Once | RESPIRATORY_TRACT | Status: DC

## 2019-06-04 MED ORDER — ALBUTEROL SULFATE HFA 108 (90 BASE) MCG/ACT IN AERS
INHALATION_SPRAY | RESPIRATORY_TRACT | Status: AC
  Administered 2019-06-04: 22:00:00 4 via RESPIRATORY_TRACT
  Filled 2019-06-04: qty 6.7

## 2019-06-04 MED ORDER — PREDNISONE 20 MG PO TABS: 40.0000 mg | ORAL_TABLET | Freq: Every day | ORAL | 0 refills | Status: DC

## 2019-06-04 MED ORDER — ALBUTEROL SULFATE HFA 108 (90 BASE) MCG/ACT IN AERS
4.0000 | INHALATION_SPRAY | Freq: Once | RESPIRATORY_TRACT | Status: AC
  Administered 2019-06-04: 22:00:00 4 via RESPIRATORY_TRACT

## 2019-06-04 MED ORDER — PREDNISONE 50 MG PO TABS
60.0000 mg | ORAL_TABLET | Freq: Once | ORAL | Status: AC
  Administered 2019-06-04: 22:00:00 60 mg via ORAL
  Filled 2019-06-04: qty 1

## 2019-06-04 NOTE — ED Notes (Signed)
Pt standing at door of room ready to leave.  Provided d/c instructions to f/u with PCP and to use inhaler with spacer.  No distress, pt ambulatory out of the dept.

## 2019-06-04 NOTE — Discharge Instructions (Signed)
Follow-up with a primary care doctor. °

## 2019-06-04 NOTE — ED Triage Notes (Signed)
/  o SOB x ~40month-worse x 2 days-seen at [facility] ED x 2 for same c/o-last covid test 7/3 was neg-NAD-steady gait

## 2019-06-04 NOTE — ED Provider Notes (Signed)
[facility] EMERGENCY DEPARTMENT Provider Note   CSN: [id number] Arrival date & time: 06/04/19  2047     History   Chief Complaint Chief Complaint  Patient presents with  . Shortness of Breath    HPI Adam May is a 48 y.o. male.     HPI Patient presents with as of breath.  Has had over the last month or 2.  Has been seen at [facility].  Has had steroids which is helped.  Smokes cigars.  No known asthma history.  Has had inhaler that helped to but no longer has it.  No chest pain.  No cough now but at the beginning of the events he had a cough.  No abdominal pain.  No swelling his legs.  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 Past Medical History:  Diagnosis Date  . Hemorrhoids     There are no active problems to display for this patient.   History reviewed. No pertinent surgical history.      Home Medications    Prior to Admission medications   Medication Sig Start Date End Date Taking? Authorizing Provider  HYDROcodone-acetaminophen (NORCO/VICODIN) 5-325 MG tablet Take 1-2 tablets every 6 hours as needed for severe pain 12/26/15   Renne CriglerGeiple, Joshua, PA-C  hydrocortisone (ANUSOL-HC) 2.5 % rectal cream Apply rectally 2 times daily 12/26/15   Renne CriglerGeiple, Joshua, PA-C  Lidocaine, Anorectal, 5 % GEL Apply topically twice a day 12/26/15   Renne CriglerGeiple, Joshua, PA-C  predniSONE (DELTASONE) 20 MG tablet Take 2 tablets (40 mg total) by mouth daily. 06/04/19   Benjiman CorePickering, Aneth Schlagel, MD    Family History No family history on file.  Social History Social History   Tobacco Use  . Smoking status: Former Smoker    Types: Cigars    Quit date: 12/19/2015    Years since quitting: 3.4  . Smokeless tobacco: Never Used  Substance Use Topics  . Alcohol use: No  . Drug use: No     Allergies   Penicillins   Review of Systems Review of Systems  Constitutional: Negative for appetite change, diaphoresis, fatigue and  fever.  HENT: Negative for congestion.   Respiratory: Positive for shortness of breath and wheezing. Negative for cough.   Cardiovascular: Negative for chest pain.  Gastrointestinal: Negative for abdominal distention.  Genitourinary: Negative for flank pain.  Musculoskeletal: Negative for back pain.  Skin: Negative for rash.  Neurological: Negative for weakness.     Physical Exam Updated Vital Signs BP (!) 141/96 (BP Location: Left Arm)   Pulse 81   Temp 98.3 F (36.8 C) (Oral)   Resp 16   Ht 6\' 3"  (1.905 m)   Wt 86.2 kg   SpO2 97%   BMI 23.75 kg/m   Physical Exam Vitals signs and nursing note reviewed.  HENT:     Head: Normocephalic.  Cardiovascular:     Rate and Rhythm: Normal rate.  Pulmonary:     Breath sounds: Wheezing present.     Comments: Diffuse wheezes without focal rales or rhonchi. Abdominal:     Tenderness: There is no abdominal tenderness.  Musculoskeletal:     Right lower leg: No edema.     Left lower leg: No edema.  Skin:    General: Skin is warm.     Capillary Refill: Capillary refill takes less than 2 seconds.  Neurological:     Mental Status: He is alert.  Comments: Patient is awake and appropriate      ED Treatments / Results  Labs (all labs ordered are listed, but only abnormal results are displayed) Labs Reviewed - No data to display  EKG None  Radiology No results found.  Procedures Procedures (including critical care time)  Medications Ordered in ED Medications  predniSONE (DELTASONE) tablet 60 mg (60 mg Oral Given 06/04/19 2219)  albuterol (VENTOLIN HFA) 108 (90 Base) MCG/ACT inhaler 4 puff (4 puffs Inhalation Given 06/04/19 2216)     Initial Impression / Assessment and Plan / ED Course  I have reviewed the triage vital signs and the nursing notes.  Pertinent labs & imaging results that were available during my care of the patient were reviewed by me and considered in my medical decision making (see chart for details).         Patient with shortness of breath.  Wheezing.  Feels better after treatment.  Given steroids.  Needs outpatient follow-up.  Reviewed previous x-ray.  Discussed about a repeat COVID test since his last one was a month ago.  Patient refused.  Discharge home.  Final Clinical Impressions(s) / ED Diagnoses   Final diagnoses:  Wheezing    ED Discharge Orders         Ordered    predniSONE (DELTASONE) 20 MG tablet  Daily     06/04/19 2310           Davonna Belling, MD 06/04/19 2322

## 2019-09-04 ENCOUNTER — Other Ambulatory Visit: Payer: Self-pay

## 2019-09-04 ENCOUNTER — Emergency Department (HOSPITAL_BASED_OUTPATIENT_CLINIC_OR_DEPARTMENT_OTHER): Payer: Self-pay

## 2019-09-04 ENCOUNTER — Emergency Department (HOSPITAL_BASED_OUTPATIENT_CLINIC_OR_DEPARTMENT_OTHER)
Admission: EM | Admit: 2019-09-04 | Discharge: 2019-09-04 | Disposition: A | Discharge status: Home / Self Care | Payer: Self-pay | Attending: Emergency Medicine | Admitting: Emergency Medicine

## 2019-09-04 ENCOUNTER — Encounter (HOSPITAL_BASED_OUTPATIENT_CLINIC_OR_DEPARTMENT_OTHER): Payer: Self-pay | Admitting: Emergency Medicine

## 2019-09-04 DIAGNOSIS — R0602 Shortness of breath: Secondary | ICD-10-CM | POA: Insufficient documentation

## 2019-09-04 DIAGNOSIS — Z87891 Personal history of nicotine dependence: Secondary | ICD-10-CM | POA: Insufficient documentation

## 2019-09-04 DIAGNOSIS — Z880 Allergy status to penicillin: Secondary | ICD-10-CM | POA: Insufficient documentation

## 2019-09-04 MED ORDER — PREDNISONE 50 MG PO TABS
60.0000 mg | ORAL_TABLET | Freq: Once | ORAL | Status: AC
  Administered 2019-09-04: 60 mg via ORAL
  Filled 2019-09-04: qty 1

## 2019-09-04 MED ORDER — PREDNISONE 20 MG PO TABS: 60.0000 mg | ORAL_TABLET | Freq: Every day | ORAL | 0 refills | Status: AC

## 2019-09-04 MED ORDER — ALBUTEROL SULFATE HFA 108 (90 BASE) MCG/ACT IN AERS
8.0000 | INHALATION_SPRAY | Freq: Once | RESPIRATORY_TRACT | Status: AC
  Administered 2019-09-04: 11:00:00 8 via RESPIRATORY_TRACT
  Filled 2019-09-04: qty 6.7

## 2019-09-04 MED FILL — predniSONE 20 MG TABS: 20 | 4 days supply | Qty: 12 | Fill #0

## 2019-09-04 NOTE — ED Triage Notes (Signed)
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  Sts he has been SOB and wheezing ever since.  No hx of asthma diagnosis, but has had resp issues before.

## 2019-09-04 NOTE — ED Provider Notes (Signed)
[place] EMERGENCY DEPARTMENT Provider Note   CSN: [id number] Arrival date & time: 09/04/19  4098     History   Chief Complaint Chief Complaint  Patient presents with  . Shortness of Breath    HPI Adam May is a 48 y.o. male.     The history is provided by the patient.  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 History of same. No smoking hx, no astham hx.)   Relieved by:  Nothing Worsened by:  Coughing Associated symptoms: cough and wheezing   Associated symptoms: no abdominal pain, no chest pain, no ear pain, no fever, no rash, no sore throat and no vomiting   Risk factors: no hx of PE/DVT and no tobacco use     Past Medical History:  Diagnosis Date  . Hemorrhoids     There are no active problems to display for this patient.   History reviewed. No pertinent surgical history.      Home Medications    Prior to Admission medications   Medication Sig Start Date End Date Taking? Authorizing Provider  albuterol (VENTOLIN HFA) 108 (90 Base) MCG/ACT inhaler INHALE 2 PUFFS INTO THE LUNGS Q 4 H PRN FOR WHEEZING 05/12/19   [provider]  predniSONE (DELTASONE) 20 MG tablet Take 3 tablets (60 mg total) by mouth daily for 4 days. 09/04/19 09/08/19  Lennice Sites, DO    Family History No family history on file.  Social History Social History   Tobacco Use  . Smoking status: Former Smoker    Types: Cigars    Quit date: 12/19/2015    Years since quitting: 3.7  . Smokeless tobacco: Never Used  Substance Use Topics  . Alcohol use: No  . Drug use: No     Allergies   Penicillins   Review of Systems Review of Systems  Constitutional: Negative for chills and fever.  HENT: Negative for ear pain and sore throat.   Eyes: Negative for  pain and visual disturbance.  Respiratory: Positive for cough, shortness of breath and wheezing.   Cardiovascular: Negative for chest pain and palpitations.  Gastrointestinal: Negative for abdominal pain and vomiting.  Genitourinary: Negative for dysuria and hematuria.  Musculoskeletal: Negative for arthralgias and back pain.  Skin: Negative for color change and rash.  Neurological: Negative for seizures and syncope.  All other systems reviewed and are negative.    Physical Exam Updated Vital Signs BP (!) 141/100 (BP Location: Right Arm)   Pulse 82   Temp 98.3 F (36.8 C) (Oral)   Resp (!) 24   Ht 6' 3.5" (1.918 m)   Wt 91 kg   SpO2 96%   BMI 24.75 kg/m   Physical Exam Vitals signs and nursing note reviewed.  Constitutional:      Appearance: He is well-developed.  HENT:     Head: Normocephalic and atraumatic.  Eyes:     Conjunctiva/sclera: Conjunctivae normal.     Pupils: Pupils are equal, round, and reactive to light.  Neck:     Musculoskeletal: Normal range of motion and neck supple.  Cardiovascular:     Rate and Rhythm: Normal rate and regular rhythm.     Pulses: Normal pulses.     Heart sounds: Normal heart sounds. No murmur.  Pulmonary:  Effort: Pulmonary effort is normal. No tachypnea or respiratory distress.     Breath sounds: Wheezing present.  Abdominal:     Palpations: Abdomen is soft.     Tenderness: There is no abdominal tenderness.  Musculoskeletal: Normal range of motion.     Right lower leg: No edema.     Left lower leg: No edema.  Skin:    General: Skin is warm and dry.     Capillary Refill: Capillary refill takes less than 2 seconds.  Neurological:     Mental Status: He is alert.      ED Treatments / Results  Labs (all labs ordered are listed, but only abnormal results are displayed) Labs Reviewed - No data to display  EKG None  Radiology Dg Chest Portable 1 View  Result Date: 09/04/2019 CLINICAL DATA:  Cough.  Shortness of  breath. EXAM: PORTABLE CHEST 1 VIEW COMPARISON:  05/12/2019. FINDINGS: Mediastinum and hilar structures normal. Heart size normal. No focal alveolar infiltrate. No pleural effusion or pneumothorax. Degenerative change thoracic spine. IMPRESSION: No acute cardiopulmonary disease. Electronically Signed   By: Maisie Fus  Register   On: 09/04/2019 10:32    Procedures Procedures (including critical care time)  Medications Ordered in ED Medications  albuterol (VENTOLIN HFA) 108 (90 Base) MCG/ACT inhaler 8 puff (8 puffs Inhalation Given 09/04/19 1056)  predniSONE (DELTASONE) tablet 60 mg (60 mg Oral Given 09/04/19 1023)     Initial Impression / Assessment and Plan / ED Course  I have reviewed the triage vital signs and the nursing notes.  Pertinent labs & imaging results that were available during my care of the patient were reviewed by me and considered in my medical decision making (see chart for details).     Adam May is a 48 year old male with no significant medical history who presents to the ED with shortness of breath, wheezing.  Patient with normal vitals.  No fever.  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  Patient states that he usually wears a mask with filters but one of the filters was not on correctly.  Had similar issue several months ago.  Got better with steroids and inhaler.  Denies any smoking history or asthma history.  Denies any chest pain.  Patient is overall well-appearing.  No signs of respiratory distress.  Has some scattered wheezing on exam.  Chest x-ray showed no infectious process.  No concern for PE or cardiac issue.  Felt better after breathing treatment.  Given prescription for steroids.  Educated about using proper masks while using fumes.  Given return precautions.  Given information to follow-up primary care doctor.  Discharged in good condition.  This chart was dictated using voice recognition software.   Despite best efforts to proofread,  errors can occur which can change the documentation meaning.    Final Clinical Impressions(s) / ED Diagnoses   Final diagnoses:  Shortness of breath    ED Discharge Orders         Ordered    predniSONE (DELTASONE) 20 MG tablet  Daily     09/04/19 1115           Virgina Norfolk, DO 09/04/19 1116

## 2019-09-04 NOTE — ED Notes (Signed)
ED Provider at bedside. 

## 2019-10-19 ENCOUNTER — Emergency Department (HOSPITAL_BASED_OUTPATIENT_CLINIC_OR_DEPARTMENT_OTHER)
Admission: EM | Admit: 2019-10-19 | Discharge: 2019-10-19 | Disposition: A | Discharge status: Home / Self Care | Payer: Self-pay | Attending: Emergency Medicine | Admitting: Emergency Medicine

## 2019-10-19 ENCOUNTER — Emergency Department (HOSPITAL_BASED_OUTPATIENT_CLINIC_OR_DEPARTMENT_OTHER): Payer: Self-pay

## 2019-10-19 ENCOUNTER — Other Ambulatory Visit: Payer: Self-pay

## 2019-10-19 ENCOUNTER — Encounter (HOSPITAL_BASED_OUTPATIENT_CLINIC_OR_DEPARTMENT_OTHER): Payer: Self-pay | Admitting: *Deleted

## 2019-10-19 DIAGNOSIS — Z20828 Contact with and (suspected) exposure to other viral communicable diseases: Secondary | ICD-10-CM | POA: Insufficient documentation

## 2019-10-19 DIAGNOSIS — J4521 Mild intermittent asthma with (acute) exacerbation: Secondary | ICD-10-CM | POA: Insufficient documentation

## 2019-10-19 DIAGNOSIS — Z87891 Personal history of nicotine dependence: Secondary | ICD-10-CM | POA: Insufficient documentation

## 2019-10-19 LAB — CBC WITH DIFFERENTIAL/PLATELET
Abs Immature Granulocytes: 0.01 10*3/uL (ref 0.00–0.07)
Basophils Absolute: 0 10*3/uL (ref 0.0–0.1)
Basophils Relative: 1 %
Eosinophils Absolute: 0.5 10*3/uL (ref 0.0–0.5)
Eosinophils Relative: 9 %
HCT: 40.9 % (ref 39.0–52.0)
Hemoglobin: 13.8 g/dL (ref 13.0–17.0)
Immature Granulocytes: 0 %
Lymphocytes Relative: 37 %
Lymphs Abs: 2.1 10*3/uL (ref 0.7–4.0)
MCH: 28.5 pg (ref 26.0–34.0)
MCHC: 33.7 g/dL (ref 30.0–36.0)
MCV: 84.3 fL (ref 80.0–100.0)
Monocytes Absolute: 0.5 10*3/uL (ref 0.1–1.0)
Monocytes Relative: 9 %
Neutro Abs: 2.5 10*3/uL (ref 1.7–7.7)
Neutrophils Relative %: 44 %
Platelets: 251 10*3/uL (ref 150–400)
RBC: 4.85 MIL/uL (ref 4.22–5.81)
RDW: 12.6 % (ref 11.5–15.5)
WBC: 5.7 10*3/uL (ref 4.0–10.5)
nRBC: 0 % (ref 0.0–0.2)

## 2019-10-19 LAB — BASIC METABOLIC PANEL
Anion gap: 6 (ref 5–15)
BUN: 19 mg/dL (ref 6–20)
CO2: 26 mmol/L (ref 22–32)
Calcium: 8.9 mg/dL (ref 8.9–10.3)
Chloride: 107 mmol/L (ref 98–111)
Creatinine, Ser: 1 mg/dL (ref 0.61–1.24)
GFR calc Af Amer: >60 mL/min (ref >60)
GFR calc non Af Amer: >60 mL/min (ref >60)
Glucose, Bld: 100 mg/dL — ABNORMAL HIGH (ref 70–99)
Potassium: 3.3 mmol/L — ABNORMAL LOW (ref 3.5–5.1)
Sodium: 139 mmol/L (ref 135–145)

## 2019-10-19 LAB — TROPONIN I (HIGH SENSITIVITY): Troponin I (High Sensitivity): 5 ng/L (ref <18)

## 2019-10-19 LAB — SARS CORONAVIRUS 2 (TAT 6-24 HRS): SARS Coronavirus 2: NEGATIVE

## 2019-10-19 MED ORDER — METHYLPREDNISOLONE SODIUM SUCC 125 MG IJ SOLR
125.0000 mg | Freq: Once | INTRAMUSCULAR | Status: AC
  Administered 2019-10-19: 125 mg via INTRAVENOUS
  Filled 2019-10-19: qty 2

## 2019-10-19 MED ORDER — PREDNISONE 20 MG PO TABS: ORAL_TABLET | ORAL | 0 refills | Status: DC

## 2019-10-19 MED ORDER — FLOVENT HFA 110 MCG/ACT IN AERO: 2.0000 | INHALATION_SPRAY | Freq: Two times a day (BID) | RESPIRATORY_TRACT | 0 refills | Status: DC

## 2019-10-19 MED ORDER — MAGNESIUM SULFATE 2 GM/50ML IV SOLN
2.0000 g | Freq: Once | INTRAVENOUS | Status: AC
  Administered 2019-10-19: 2 g via INTRAVENOUS
  Filled 2019-10-19: qty 50

## 2019-10-19 MED ORDER — ALBUTEROL SULFATE HFA 108 (90 BASE) MCG/ACT IN AERS
8.0000 | INHALATION_SPRAY | Freq: Once | RESPIRATORY_TRACT | Status: AC
  Administered 2019-10-19: 01:00:00 8 via RESPIRATORY_TRACT
  Filled 2019-10-19: qty 6.7

## 2019-10-19 NOTE — ED Triage Notes (Signed)
Pt reports feeling SOB with cough x 3 days. NAD. Ambulates easily ro room 11

## 2019-10-19 NOTE — ED Triage Notes (Signed)
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

## 2019-10-19 NOTE — ED Provider Notes (Signed)
[facility] EMERGENCY DEPARTMENT Provider Note   CSN: [id number] Arrival date & time: 10/19/19  0036     History Chief Complaint  Patient presents with   Shortness of Breath    Adam May is a 48 y.o. male.  The history is provided by the patient.  Wheezing Severity:  Moderate Severity compared to prior episodes:  Similar Onset quality:  Gradual Duration:  3 days Timing:  Constant Progression:  Unchanged Chronicity:  Recurrent Context: fumes   Context: not animal exposure   Relieved by:  Nothing Worsened by:  Nothing Ineffective treatments:  Beta-agonist inhaler Associated symptoms: no chest pain, no chest tightness, no cough, no fever, no headaches, no orthopnea, no shortness of breath, no sore throat, no sputum production, no stridor and no swollen glands        Past Medical History:  Diagnosis Date   Hemorrhoids     There are no problems to display for this patient.   History reviewed. No pertinent surgical history.     History reviewed. No pertinent family history.  Social History   Tobacco Use   Smoking status: Former Smoker    Types: Cigars    Quit date: 12/19/2015    Years since quitting: 3.8   Smokeless tobacco: Never Used  Substance Use Topics   Alcohol use: No   Drug use: No    Home Medications Prior to Admission medications   Medication Sig Start Date End Date Taking? Authorizing Provider  albuterol (VENTOLIN HFA) 108 (90 Base) MCG/ACT inhaler INHALE 2 PUFFS INTO THE LUNGS Q 4 H PRN FOR WHEEZING 05/12/19   [provider]  fluticasone (FLOVENT HFA) 110 MCG/ACT inhaler Inhale 2 puffs into the lungs 2 (two) times daily. 10/19/19   Brigida Scotti, MD  predniSONE (DELTASONE) 20 MG tablet 3 tabs po day one, then 2 po daily x 4 days 10/19/19   Seymour Pavlak, MD    Allergies    Penicillins  Review of Systems   Review of Systems  Constitutional: Negative for fever.  HENT: Negative for sore throat.   Eyes:  Negative for visual disturbance.  Respiratory: Positive for wheezing. Negative for cough, sputum production, chest tightness, shortness of breath and stridor.   Cardiovascular: Negative for chest pain and orthopnea.  Gastrointestinal: Negative for abdominal pain.  Genitourinary: Negative for difficulty urinating.  Musculoskeletal: Negative for arthralgias.  Neurological: Negative for headaches.  Psychiatric/Behavioral: Negative for agitation.  All other systems reviewed and are negative.   Physical Exam Updated Vital Signs BP 129/90 (BP Location: Right Arm)    Pulse 76    Temp 98.2 F (36.8 C) (Oral)    Resp 20    Ht 6' 3.5" (1.918 m)    Wt 90.7 kg    SpO2 96%    BMI 24.67 kg/m   Physical Exam Vitals and nursing note reviewed.  Constitutional:      General: He is not in acute distress.    Appearance: Normal appearance.  HENT:     Head: Normocephalic and atraumatic.     Nose: Nose normal.  Eyes:     Conjunctiva/sclera: Conjunctivae normal.     Pupils: Pupils are equal, round, and reactive to light.  Cardiovascular:     Rate and Rhythm: Normal rate and regular rhythm.     Pulses: Normal pulses.     Heart sounds: Normal heart sounds.  Pulmonary:     Effort: No respiratory distress.     Breath sounds: Wheezing present.  Abdominal:  General: Abdomen is flat. Bowel sounds are normal.     Tenderness: There is no abdominal tenderness. There is no guarding or rebound.  Musculoskeletal:        General: Normal range of motion.     Cervical back: Normal range of motion and neck supple.  Skin:    General: Skin is warm and dry.     Capillary Refill: Capillary refill takes less than 2 seconds.  Neurological:     General: No focal deficit present.     Mental Status: He is alert and oriented to person, place, and time.     Deep Tendon Reflexes: Reflexes normal.  Psychiatric:        Mood and Affect: Mood normal.        Behavior: Behavior normal.     ED Results / Procedures /  Treatments   Labs (all labs ordered are listed, but only abnormal results are displayed) Results for orders placed or performed during the hospital encounter of 10/19/19  CBC with Differential  Result Value Ref Range   WBC 5.7 4.0 - 10.5 K/uL   RBC 4.85 4.22 - 5.81 MIL/uL   Hemoglobin 13.8 13.0 - 17.0 g/dL   HCT 16.140.9 09.639.0 - 04.552.0 %   MCV 84.3 80.0 - 100.0 fL   MCH 28.5 26.0 - 34.0 pg   MCHC 33.7 30.0 - 36.0 g/dL   RDW 40.912.6 81.111.5 - 91.415.5 %   Platelets 251 150 - 400 K/uL   nRBC 0.0 0.0 - 0.2 %   Neutrophils Relative % 44 %   Neutro Abs 2.5 1.7 - 7.7 K/uL   Lymphocytes Relative 37 %   Lymphs Abs 2.1 0.7 - 4.0 K/uL   Monocytes Relative 9 %   Monocytes Absolute 0.5 0.1 - 1.0 K/uL   Eosinophils Relative 9 %   Eosinophils Absolute 0.5 0.0 - 0.5 K/uL   Basophils Relative 1 %   Basophils Absolute 0.0 0.0 - 0.1 K/uL   Immature Granulocytes 0 %   Abs Immature Granulocytes 0.01 0.00 - 0.07 K/uL  Basic metabolic panel  Result Value Ref Range   Sodium 139 135 - 145 mmol/L   Potassium 3.3 (L) 3.5 - 5.1 mmol/L   Chloride 107 98 - 111 mmol/L   CO2 26 22 - 32 mmol/L   Glucose, Bld 100 (H) 70 - 99 mg/dL   BUN 19 6 - 20 mg/dL   Creatinine, Ser 7.821.00 0.61 - 1.24 mg/dL   Calcium 8.9 8.9 - 95.610.3 mg/dL   GFR calc non Af Amer >60 >60 mL/min   GFR calc Af Amer >60 >60 mL/min   Anion gap 6 5 - 15  Troponin I (High Sensitivity)  Result Value Ref Range   Troponin I (High Sensitivity) 5 <18 ng/L   DG Chest Portable 1 View  Result Date: 10/19/2019 CLINICAL DATA:  Shortness of breath for several days EXAM: PORTABLE CHEST 1 VIEW COMPARISON:  09/04/2019 FINDINGS: The heart size and mediastinal contours are within normal limits. Both lungs are clear. The visualized skeletal structures are unremarkable. IMPRESSION: No active disease. Electronically Signed   By: Alcide CleverMark  Lukens M.D.   On: 10/19/2019 02:16    EKG  Date: 10/19/2019  Rate:71  Rhythm: normal sinus rhythm  QRS Axis: normal  Intervals: normal   ST/T Wave abnormalities: normal  Conduction Disutrbances: none  Narrative Interpretation: unremarkable     Radiology DG Chest Portable 1 View  Result Date: 10/19/2019 CLINICAL DATA:  Shortness of breath for several  days EXAM: PORTABLE CHEST 1 VIEW COMPARISON:  09/04/2019 FINDINGS: The heart size and mediastinal contours are within normal limits. Both lungs are clear. The visualized skeletal structures are unremarkable. IMPRESSION: No active disease. Electronically Signed   By: Alcide Clever M.D.   On: 10/19/2019 02:16    Procedures Procedures (including critical care time)  Medications Ordered in ED Medications  albuterol (VENTOLIN HFA) 108 (90 Base) MCG/ACT inhaler 8 puff (8 puffs Inhalation Given 10/19/19 0120)  methylPREDNISolone sodium succinate (SOLU-MEDROL) 125 mg/2 mL injection 125 mg (125 mg Intravenous Given 10/19/19 0300)  magnesium sulfate IVPB 2 g 50 mL (0 g Intravenous Stopped 10/19/19 0352)    ED Course  I have reviewed the triage vital signs and the nursing notes.  Pertinent labs & imaging results that were available during my care of the patient were reviewed by me and considered in my medical decision making (see chart for details).   Did well post medication.  Patient is not on a controller will start flovent.  Give that he works around others will send covid and quarantine patient until test returns.  Heart score is 1.  Very low risk for MACE given time course one troponin is sufficient to rule out ACS.  PERC negative wells 0 highly doubt PE in this low risk patient.    Adam May was evaluated in Emergency Department on 10/19/2019 for the symptoms described in the history of present illness. He was evaluated in the context of the global COVID-19 pandemic, which necessitated consideration that the patient might be at risk for infection with the SARS-CoV-2 virus that causes COVID-19. Institutional protocols and algorithms that pertain to the evaluation of patients  at risk for COVID-19 are in a state of rapid change based on information released by regulatory bodies including the CDC and federal and state organizations. These policies and algorithms were followed during the patient's care in the ED.   Final Clinical Impression(s) / ED Diagnoses Final diagnoses:  Mild intermittent asthma with exacerbation   Return for intractable cough, coughing up blood,fevers >100.4 unrelieved by medication, shortness of breath, intractable vomiting, chest pain, shortness of breath, weakness,numbness, changes in speech, facial asymmetry,abdominal pain, passing out,Inability to tolerate liquids or food, cough, altered mental status or any concerns. No signs of systemic illness or infection. The patient is nontoxic-appearing on exam and vital signs are within normal limits.   I have reviewed the triage vital signs and the nursing notes. Pertinent labs &imaging results that were available during my care of the patient were reviewed by me and considered in my medical decision making (see chart for details).  After history, exam, and medical workup I feel the patient has been appropriately medically screened and is safe for discharge home. Pertinent diagnoses were discussed with the patient. Patient was given return  Rx / DC Orders ED Discharge Orders         Ordered    predniSONE (DELTASONE) 20 MG tablet     10/19/19 0424    fluticasone (FLOVENT HFA) 110 MCG/ACT inhaler  2 times daily,   Status:  Discontinued     10/19/19 0424    fluticasone (FLOVENT HFA) 110 MCG/ACT inhaler  2 times daily     10/19/19 0439           Dillen Belmontes, MD 10/19/19 [id number]

## 2019-12-11 ENCOUNTER — Encounter (HOSPITAL_BASED_OUTPATIENT_CLINIC_OR_DEPARTMENT_OTHER): Payer: Self-pay | Admitting: Emergency Medicine

## 2019-12-11 ENCOUNTER — Other Ambulatory Visit: Payer: Self-pay

## 2019-12-11 ENCOUNTER — Emergency Department (HOSPITAL_BASED_OUTPATIENT_CLINIC_OR_DEPARTMENT_OTHER)
Admission: EM | Admit: 2019-12-11 | Discharge: 2019-12-11 | Disposition: A | Discharge status: Home / Self Care | Payer: Self-pay | Attending: Emergency Medicine | Admitting: Emergency Medicine

## 2019-12-11 DIAGNOSIS — J4521 Mild intermittent asthma with (acute) exacerbation: Secondary | ICD-10-CM | POA: Insufficient documentation

## 2019-12-11 DIAGNOSIS — Z79899 Other long term (current) drug therapy: Secondary | ICD-10-CM | POA: Insufficient documentation

## 2019-12-11 DIAGNOSIS — Z87891 Personal history of nicotine dependence: Secondary | ICD-10-CM | POA: Insufficient documentation

## 2019-12-11 DIAGNOSIS — Z880 Allergy status to penicillin: Secondary | ICD-10-CM | POA: Insufficient documentation

## 2019-12-11 MED ORDER — ALBUTEROL SULFATE HFA 108 (90 BASE) MCG/ACT IN AERS
INHALATION_SPRAY | RESPIRATORY_TRACT | Status: AC
  Administered 2019-12-11: 2
  Filled 2019-12-11: qty 6.7

## 2019-12-11 MED ORDER — PREDNISONE 20 MG PO TABS: 40.0000 mg | ORAL_TABLET | Freq: Every day | ORAL | 0 refills | Status: DC

## 2019-12-11 MED FILL — predniSONE 20 MG TABS: 20 | 5 days supply | Qty: 10 | Fill #0

## 2019-12-11 NOTE — ED Provider Notes (Addendum)
[facility] EMERGENCY DEPARTMENT Provider Note   CSN: [id number] Arrival date & time: 12/11/19  1048     History Chief Complaint  Patient presents with  . Shortness of Breath    Adam May is a 49 y.o. male who presents with SOB and wheezing.  Patient has no formal diagnosis of asthma or COPD but is seen in the ED multiple times for shortness of breath and wheezing.  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  He states he ran out of his inhaler the last time he was here.  He adamantly denies any symptoms consistent with Covid.  No fevers, chills, URI symptoms, significant cough, chest pain.  He does have shortness of breath associated with the wheezing.  He was given albuterol in triage and feels better.  He is requesting prescription for steroid and states that he is working on getting a primary care provider in medical insurance.  HPI     Past Medical History:  Diagnosis Date  . Hemorrhoids     There are no problems to display for this patient.   History reviewed. No pertinent surgical history.     No family history on file.  Social History   Tobacco Use  . Smoking status: Former Smoker    Types: Cigars    Quit date: 12/19/2015    Years since quitting: 3.9  . Smokeless tobacco: Never Used  Substance Use Topics  . Alcohol use: No  . Drug use: No    Home Medications Prior to Admission medications   Medication Sig Start Date End Date Taking? Authorizing Provider  albuterol (VENTOLIN HFA) 108 (90 Base) MCG/ACT inhaler INHALE 2 PUFFS INTO THE LUNGS Q 4 H PRN FOR WHEEZING 05/12/19   [provider]  fluticasone (FLOVENT HFA) 110 MCG/ACT inhaler Inhale 2 puffs into the lungs 2 (two) times daily. 10/19/19   Palumbo, April, MD  predniSONE (DELTASONE) 20 MG tablet 3 tabs po day one, then 2 po daily x 4 days 10/19/19   Palumbo, April, MD    Allergies    Penicillins  Review of Systems   Review  of Systems  Constitutional: Negative for chills and fever.  Respiratory: Positive for cough, shortness of breath and wheezing.   Cardiovascular: Negative for chest pain.  All other systems reviewed and are negative.   Physical Exam Updated Vital Signs BP (!) 132/98 (BP Location: Right Arm)   Pulse 73   Temp 98.3 F (36.8 C) (Oral)   Resp 16   Ht 6' 3.5" (1.918 m)   Wt 91.8 kg   SpO2 96%   BMI 24.96 kg/m   Physical Exam Vitals and nursing note reviewed.  Constitutional:      General: He is not in acute distress.    Appearance: Normal appearance. He is well-developed. He is not ill-appearing.  HENT:     Head: Normocephalic and atraumatic.  Eyes:     General: No scleral icterus.       Right eye: No discharge.        Left eye: No discharge.     Conjunctiva/sclera: Conjunctivae normal.     Pupils: Pupils are equal, round, and reactive to light.  Cardiovascular:     Rate and Rhythm: Normal rate and regular rhythm.  Pulmonary:     Effort: Pulmonary effort is normal. No respiratory distress.     Breath sounds: Wheezing (mild diffuse expiratory wheezing) present.  Abdominal:     General: There is no distension.     Palpations: Abdomen is soft.     Tenderness: There is no abdominal tenderness.  Musculoskeletal:     Cervical back: Normal range of motion.     Right lower leg: No edema.     Left lower leg: No edema.  Skin:    General: Skin is warm and dry.  Neurological:     Mental Status: He is alert and oriented to person, place, and time.  Psychiatric:        Behavior: Behavior normal.     ED Results / Procedures / Treatments   Labs (all labs ordered are listed, but only abnormal results are displayed) Labs Reviewed - No data to display  EKG None  Radiology No results found.  Procedures Procedures (including critical care time)  Medications Ordered in ED Medications  albuterol (VENTOLIN HFA) 108 (90 Base) MCG/ACT inhaler (2 puffs  Given 12/11/19 1239)     ED Course  I have reviewed the triage vital signs and the nursing notes.  Pertinent labs & imaging results that were available during my care of the patient were reviewed by me and considered in my medical decision making (see chart for details).  49 year old male presents with shortness of breath and wheezing for the past couple of days after being out of his inhaler and being exposed to secondhand smoke.  Vital signs are reassuring here.  He is feels better after albuterol treatment in triage.  He does still have wheezing on exam and is requesting a steroid burst.  Since patient has been in the ED multiple times with the same problem I do not think we need any imaging today.  Patient adamantly denies that he has Covid and does not want Covid testing.  Will prescribe steroid burst and return precautions discussed.  Adam May was evaluated in Emergency Department on 12/11/2019 for the symptoms described in the history of present illness. He was evaluated in the context of the global COVID-19 pandemic, which necessitated consideration that the patient might be at risk for infection with the SARS-CoV-2 virus that causes COVID-19. Institutional protocols and algorithms that pertain to the evaluation of patients at risk for COVID-19 are in a state of rapid change based on information released by regulatory bodies including the CDC and federal and state organizations. These policies and algorithms were followed during the patient's care in the ED.    MDM Rules/Calculators/A&P                       Final Clinical Impression(s) / ED Diagnoses Final diagnoses:  Mild intermittent asthma with exacerbation    Rx / DC Orders ED Discharge Orders    None       Bethel Born, PA-C 12/11/19 1444    Bethel Born, PA-C 12/11/19 1445    Terald Sleeper, MD 12/11/19 [facility]

## 2019-12-11 NOTE — ED Triage Notes (Signed)
PT presents with c/o shortness of breath for about 3 days . Pt states he ran out of his inhaler.

## 2019-12-11 NOTE — ED Notes (Signed)
Patient verbalizes understanding of discharge instructions. Opportunity for questioning and answers were provided. Armband removed by staff, pt discharged from ED.  

## 2019-12-11 NOTE — Discharge Instructions (Signed)
Use inhaler as needed for shortness of breath and wheezing Start Prednisone - take once daily for 5 days Please return if you are worsening

## 2020-01-09 ENCOUNTER — Encounter (HOSPITAL_BASED_OUTPATIENT_CLINIC_OR_DEPARTMENT_OTHER): Payer: Self-pay | Admitting: Emergency Medicine

## 2020-01-09 ENCOUNTER — Emergency Department (HOSPITAL_BASED_OUTPATIENT_CLINIC_OR_DEPARTMENT_OTHER)
Admission: EM | Admit: 2020-01-09 | Discharge: 2020-01-09 | Disposition: A | Discharge status: Home / Self Care | Payer: Self-pay | Attending: Emergency Medicine | Admitting: Emergency Medicine

## 2020-01-09 ENCOUNTER — Emergency Department (HOSPITAL_BASED_OUTPATIENT_CLINIC_OR_DEPARTMENT_OTHER): Payer: Self-pay

## 2020-01-09 ENCOUNTER — Other Ambulatory Visit: Payer: Self-pay

## 2020-01-09 DIAGNOSIS — R0789 Other chest pain: Secondary | ICD-10-CM | POA: Insufficient documentation

## 2020-01-09 DIAGNOSIS — Z87891 Personal history of nicotine dependence: Secondary | ICD-10-CM | POA: Insufficient documentation

## 2020-01-09 DIAGNOSIS — J4521 Mild intermittent asthma with (acute) exacerbation: Secondary | ICD-10-CM | POA: Insufficient documentation

## 2020-01-09 DIAGNOSIS — Z880 Allergy status to penicillin: Secondary | ICD-10-CM | POA: Insufficient documentation

## 2020-01-09 MED ORDER — IPRATROPIUM-ALBUTEROL 0.5-2.5 (3) MG/3ML IN SOLN
3.0000 mL | Freq: Once | RESPIRATORY_TRACT | Status: AC
  Administered 2020-01-09: 10:00:00 3 mL via RESPIRATORY_TRACT
  Filled 2020-01-09: qty 3

## 2020-01-09 MED ORDER — KETOROLAC TROMETHAMINE 30 MG/ML IJ SOLN
30.0000 mg | Freq: Once | INTRAMUSCULAR | Status: AC
  Administered 2020-01-09: 30 mg via INTRAMUSCULAR
  Filled 2020-01-09: qty 1

## 2020-01-09 MED ORDER — ALBUTEROL SULFATE HFA 108 (90 BASE) MCG/ACT IN AERS
1.0000 | INHALATION_SPRAY | RESPIRATORY_TRACT | Status: DC | PRN
  Administered 2020-01-09: 10:00:00 2 via RESPIRATORY_TRACT
  Filled 2020-01-09: qty 6.7

## 2020-01-09 MED ORDER — IBUPROFEN 600 MG PO TABS: 600.0000 mg | ORAL_TABLET | Freq: Four times a day (QID) | ORAL | 0 refills | Status: AC | PRN

## 2020-01-09 MED ORDER — FLOVENT HFA 110 MCG/ACT IN AERO: 2.0000 | INHALATION_SPRAY | Freq: Two times a day (BID) | RESPIRATORY_TRACT | 0 refills | Status: AC

## 2020-01-09 MED ORDER — AEROCHAMBER PLUS FLO-VU MEDIUM MISC
1.0000 | Freq: Once | Status: AC
  Administered 2020-01-09: 10:00:00 1
  Filled 2020-01-09: qty 1

## 2020-01-09 MED FILL — IBUPROFEN 600 MG TABLET: 600 | 7 days supply | Qty: 30 | Fill #0

## 2020-01-09 MED FILL — FLOVENT HFA 110 MCG INHALER: 110 | 30 days supply | Qty: 12 | Fill #0

## 2020-01-09 NOTE — ED Notes (Signed)
RT at bedside.

## 2020-01-09 NOTE — ED Triage Notes (Signed)
Pt presents for pain in his left side, just below last rib.  Sts he coughed this morning and he felt something "tear in there" and it has been hurting ever since.

## 2020-01-09 NOTE — ED Provider Notes (Signed)
[facility] EMERGENCY DEPARTMENT Provider Note   CSN: [id number] Arrival date & time: 01/09/20  0944     History Chief Complaint  Patient presents with  . left side pain    Adam May is a 49 y.o. male.  Pt presents to the ED today with left sided cp.  Pt said he felt something tear when he coughed.  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  He is on albuterol and was recently started on prednisone.  He does not have a pcp due to no insurance.  Pt denies any f/c.  No known covid exposures.        Past Medical History:  Diagnosis Date  . Hemorrhoids     There are no problems to display for this patient.   History reviewed. No pertinent surgical history.     No family history on file.  Social History   Tobacco Use  . Smoking status: Former Smoker    Types: Cigars    Quit date: 12/19/2015    Years since quitting: 4.0  . Smokeless tobacco: Never Used  Substance Use Topics  . Alcohol use: No  . Drug use: No    Home Medications Prior to Admission medications   Medication Sig Start Date End Date Taking? Authorizing Provider  albuterol (VENTOLIN HFA) 108 (90 Base) MCG/ACT inhaler INHALE 2 PUFFS INTO THE LUNGS Q 4 H PRN FOR WHEEZING 05/12/19   [provider]  fluticasone (FLOVENT HFA) 110 MCG/ACT inhaler Inhale 2 puffs into the lungs 2 (two) times daily. 01/09/20   Jacalyn Lefevre, MD  ibuprofen (ADVIL) 600 MG tablet Take 1 tablet (600 mg total) by mouth every 6 (six) hours as needed. 01/09/20   Jacalyn Lefevre, MD    Allergies    Penicillins  Review of Systems   Review of Systems  Respiratory: Positive for cough and wheezing.   Musculoskeletal:       Left chest pain  All other systems reviewed and are negative.   Physical Exam Updated Vital Signs BP (!) 150/93 (BP Location: Right Arm)   Pulse 73   Temp 98.4 F (36.9 C) (Oral)   Resp 16   Ht 6\' 3"  (1.905 m)   Wt 90.7 kg   SpO2 97%   BMI 25.00  kg/m   Physical Exam Vitals and nursing note reviewed.  Constitutional:      Appearance: Normal appearance.  HENT:     Head: Normocephalic and atraumatic.     Right Ear: External ear normal.     Left Ear: External ear normal.     Nose: Nose normal.     Mouth/Throat:     Mouth: Mucous membranes are moist.     Pharynx: Oropharynx is clear.  Eyes:     Extraocular Movements: Extraocular movements intact.     Conjunctiva/sclera: Conjunctivae normal.     Pupils: Pupils are equal, round, and reactive to light.  Cardiovascular:     Rate and Rhythm: Normal rate and regular rhythm.     Pulses: Normal pulses.     Heart sounds: Normal heart sounds.  Pulmonary:     Effort: Pulmonary effort is normal.     Breath sounds: Wheezing present.  Abdominal:     General: Abdomen is flat. Bowel sounds are normal.     Palpations: Abdomen is soft.       Comments: Tenderness under left lower ribs  Musculoskeletal:  General: Normal range of motion.     Cervical back: Normal range of motion and neck supple.  Skin:    General: Skin is warm.     Capillary Refill: Capillary refill takes less than 2 seconds.  Neurological:     General: No focal deficit present.     Mental Status: He is alert and oriented to person, place, and time.  Psychiatric:        Mood and Affect: Mood normal.        Behavior: Behavior normal.     ED Results / Procedures / Treatments   Labs (all labs ordered are listed, but only abnormal results are displayed) Labs Reviewed - No data to display  EKG None  Radiology DG Chest 2 View  Result Date: 01/09/2020 CLINICAL DATA:  Inferior left chest pain since a coughing episode this morning. EXAM: CHEST - 2 VIEW COMPARISON:  Single-view of the chest 10/19/2019. PA and lateral chest 05/05/2019 FINDINGS: Lungs clear. No pneumothorax or pleural fluid. Heart size normal. No bony abnormality. IMPRESSION: Normal chest. Electronically Signed   By: Inge Rise M.D.   On:  01/09/2020 10:35    Procedures Procedures (including critical care time)  Medications Ordered in ED Medications  ipratropium-albuterol (DUONEB) 0.5-2.5 (3) MG/3ML nebulizer solution 3 mL (has no administration in time range)  albuterol (VENTOLIN HFA) 108 (90 Base) MCG/ACT inhaler 1-2 puff (2 puffs Inhalation Given 01/09/20 1022)  ketorolac (TORADOL) 30 MG/ML injection 30 mg (has no administration in time range)  AeroChamber Plus Flo-Vu Medium MISC 1 each (1 each Other Given 01/09/20 1022)    ED Course  I have reviewed the triage vital signs and the nursing notes.  Pertinent labs & imaging results that were available during my care of the patient were reviewed by me and considered in my medical decision making (see chart for details).    MDM Rules/Calculators/A&P                     Pt is feeling better after the duoneb.  He is given an albuterol mdi and spacer.  He already has prednisone.  He is d/c with a rx for flovent.  Pt knows to return if worse and to establish with a pcp.  Final Clinical Impression(s) / ED Diagnoses Final diagnoses:  Chest wall pain  Mild intermittent asthma with exacerbation    Rx / DC Orders ED Discharge Orders         Ordered    ibuprofen (ADVIL) 600 MG tablet  Every 6 hours PRN     01/09/20 1041    fluticasone (FLOVENT HFA) 110 MCG/ACT inhaler  2 times daily     01/09/20 1041           Isla Pence, MD 01/09/20 1043

## 2020-01-29 MED FILL — FLOVENT HFA 110 MCG INHALER: 110 | 30 days supply | Qty: 12 | Fill #0

## 2020-08-02 ENCOUNTER — Emergency Department (HOSPITAL_BASED_OUTPATIENT_CLINIC_OR_DEPARTMENT_OTHER)
Admission: EM | Admit: 2020-08-02 | Discharge: 2020-08-02 | Disposition: A | Discharge status: Home / Self Care | Payer: BLUE CROSS/BLUE SHIELD | Attending: Emergency Medicine | Admitting: Emergency Medicine

## 2020-08-02 ENCOUNTER — Encounter (HOSPITAL_BASED_OUTPATIENT_CLINIC_OR_DEPARTMENT_OTHER): Payer: Self-pay | Admitting: Emergency Medicine

## 2020-08-02 ENCOUNTER — Other Ambulatory Visit: Payer: Self-pay

## 2020-08-02 DIAGNOSIS — J45901 Unspecified asthma with (acute) exacerbation: Secondary | ICD-10-CM

## 2020-08-02 DIAGNOSIS — Y929 Unspecified place or not applicable: Secondary | ICD-10-CM | POA: Insufficient documentation

## 2020-08-02 DIAGNOSIS — Z20822 Contact with and (suspected) exposure to covid-19: Secondary | ICD-10-CM | POA: Diagnosis not present

## 2020-08-02 DIAGNOSIS — Z7951 Long term (current) use of inhaled steroids: Secondary | ICD-10-CM | POA: Diagnosis not present

## 2020-08-02 DIAGNOSIS — I10 Essential (primary) hypertension: Secondary | ICD-10-CM | POA: Diagnosis not present

## 2020-08-02 DIAGNOSIS — Z79899 Other long term (current) drug therapy: Secondary | ICD-10-CM | POA: Diagnosis not present

## 2020-08-02 DIAGNOSIS — Z87891 Personal history of nicotine dependence: Secondary | ICD-10-CM | POA: Insufficient documentation

## 2020-08-02 DIAGNOSIS — R0602 Shortness of breath: Secondary | ICD-10-CM | POA: Diagnosis present

## 2020-08-02 HISTORY — DX: Essential (primary) hypertension: I10

## 2020-08-02 HISTORY — DX: Unspecified asthma, uncomplicated: J45.909

## 2020-08-02 LAB — BASIC METABOLIC PANEL
Anion gap: 10 (ref 5–15)
BUN: 17 mg/dL (ref 6–20)
CO2: 27 mmol/L (ref 22–32)
Calcium: 8.9 mg/dL (ref 8.9–10.3)
Chloride: 104 mmol/L (ref 98–111)
Creatinine, Ser: 1.08 mg/dL (ref 0.61–1.24)
GFR calc Af Amer: >60 mL/min (ref >60)
GFR calc non Af Amer: >60 mL/min (ref >60)
Glucose, Bld: 109 mg/dL — ABNORMAL HIGH (ref 70–99)
Potassium: 3.5 mmol/L (ref 3.5–5.1)
Sodium: 141 mmol/L (ref 135–145)

## 2020-08-02 LAB — RESPIRATORY PANEL BY RT PCR (FLU A&B, COVID)
Influenza A by PCR: NEGATIVE
Influenza B by PCR: NEGATIVE
SARS Coronavirus 2 by RT PCR: NEGATIVE

## 2020-08-02 LAB — CBC
HCT: 43.6 % (ref 39.0–52.0)
Hemoglobin: 14.9 g/dL (ref 13.0–17.0)
MCH: 28.7 pg (ref 26.0–34.0)
MCHC: 34.2 g/dL (ref 30.0–36.0)
MCV: 84 fL (ref 80.0–100.0)
Platelets: 248 10*3/uL (ref 150–400)
RBC: 5.19 MIL/uL (ref 4.22–5.81)
RDW: 12.8 % (ref 11.5–15.5)
WBC: 8.5 10*3/uL (ref 4.0–10.5)
nRBC: 0 % (ref 0.0–0.2)

## 2020-08-02 MED ORDER — PREDNISONE 50 MG PO TABS: 50.0000 mg | ORAL_TABLET | Freq: Every day | ORAL | 0 refills | Status: AC

## 2020-08-02 MED ORDER — MAGNESIUM SULFATE 2 GM/50ML IV SOLN
2.0000 g | Freq: Once | INTRAVENOUS | Status: AC
  Administered 2020-08-02: 2 g via INTRAVENOUS
  Filled 2020-08-02: qty 50

## 2020-08-02 MED ORDER — ALBUTEROL SULFATE (2.5 MG/3ML) 0.083% IN NEBU
INHALATION_SOLUTION | RESPIRATORY_TRACT | Status: AC
  Administered 2020-08-02: 5 mg
  Filled 2020-08-02: qty 6

## 2020-08-02 MED ORDER — ALBUTEROL (5 MG/ML) CONTINUOUS INHALATION SOLN
10.0000 mg/h | INHALATION_SOLUTION | RESPIRATORY_TRACT | Status: AC
  Administered 2020-08-02: 10 mg/h via RESPIRATORY_TRACT

## 2020-08-02 MED ORDER — IPRATROPIUM-ALBUTEROL 0.5-2.5 (3) MG/3ML IN SOLN
RESPIRATORY_TRACT | Status: AC
  Administered 2020-08-02: 3 mL
  Filled 2020-08-02: qty 3

## 2020-08-02 MED ORDER — ALBUTEROL SULFATE (2.5 MG/3ML) 0.083% IN NEBU
INHALATION_SOLUTION | RESPIRATORY_TRACT | Status: AC
  Administered 2020-08-02: 2.5 mg
  Filled 2020-08-02: qty 3

## 2020-08-02 MED ORDER — ALBUTEROL (5 MG/ML) CONTINUOUS INHALATION SOLN
10.0000 mg/h | INHALATION_SOLUTION | RESPIRATORY_TRACT | Status: AC
  Administered 2020-08-02: 10 mg/h via RESPIRATORY_TRACT
  Filled 2020-08-02: qty 20

## 2020-08-02 MED ORDER — IPRATROPIUM BROMIDE 0.02 % IN SOLN
0.5000 mg | Freq: Once | RESPIRATORY_TRACT | Status: AC
  Administered 2020-08-02: 0.5 mg via RESPIRATORY_TRACT
  Filled 2020-08-02: qty 2.5

## 2020-08-02 MED ORDER — METHYLPREDNISOLONE SODIUM SUCC 125 MG IJ SOLR
125.0000 mg | Freq: Once | INTRAMUSCULAR | Status: AC
  Administered 2020-08-02: 125 mg via INTRAVENOUS
  Filled 2020-08-02: qty 2

## 2020-08-02 NOTE — ED Notes (Signed)
Peak flow results X 3= 175.   Physician notified.

## 2020-08-02 NOTE — ED Notes (Signed)
RT in triage for neb tx

## 2020-08-02 NOTE — ED Triage Notes (Signed)
States " I had been SOB x3 days" Hx asthma

## 2020-08-02 NOTE — Discharge Instructions (Addendum)
Continue taking home medications as prescribed. Take prednisone as prescribed.  Follow-up with your primary care doctor for further management of your asthma. Return to the emergency room if you develop increased difficulty breathing, chest pain, or any new, worsening, or concerning symptoms.

## 2020-08-02 NOTE — ED Provider Notes (Signed)
[facility] EMERGENCY DEPARTMENT Provider Note   CSN: [id number] Arrival date & time: 08/02/20  1856     History No chief complaint on file.   Adam May is a 49 y.o. male presenting for evaluation of shortness of breath, chest tightness, wheezing.  Patient states that the past week, he has had gradually worsening shortness of breath.  Is consistent with his asthma.  He reports a dry cough which is normal for his asthma.  He has been using his albuterol frequently, initially was helping but now it is not.  He denies associated fevers, chills, chest pain, nausea, vomiting, Donnell pain.  He has not received his Covid vaccines.  He was recently admitted for the same.  He reports no other medical problems, takes no other medications daily.  He denies sick contacts.  No recent travel, surgeries, immobilization, history of cancer, history previous DVT/PE, or hormone use.  Additional history came from chart review.  Per chart review, patient also has a history of hypertension.  He was admitted the end of June for hypertension and asthma.  HPI     Past Medical History:  Diagnosis Date  . Asthma   . Hemorrhoids   . Hypertension     There are no problems to display for this patient.   History reviewed. No pertinent surgical history.     No family history on file.  Social History   Tobacco Use  . Smoking status: Former Smoker    Types: Cigars    Quit date: 12/19/2015    Years since quitting: 4.6  . Smokeless tobacco: Never Used  Vaping Use  . Vaping Use: Never used  Substance Use Topics  . Alcohol use: No  . Drug use: No    Home Medications Prior to Admission medications   Medication Sig Start Date End Date Taking? Authorizing Provider  albuterol (PROVENTIL) (2.5 MG/3ML) 0.083% nebulizer solution Inhale into the lungs. 05/04/20  Yes [provider]  albuterol (VENTOLIN HFA) 108 (90 Base) MCG/ACT inhaler INHALE 2 PUFFS INTO THE LUNGS Q 4 H PRN FOR  WHEEZING 05/12/19  Yes [provider]  albuterol (VENTOLIN HFA) 108 (90 Base) MCG/ACT inhaler Inhale into the lungs. 06/02/20   [provider]  fluticasone (FLOVENT HFA) 110 MCG/ACT inhaler Inhale 2 puffs into the lungs 2 (two) times daily. 01/09/20   Jacalyn Lefevre, MD  ibuprofen (ADVIL) 600 MG tablet Take 1 tablet (600 mg total) by mouth every 6 (six) hours as needed. 01/09/20   Jacalyn Lefevre, MD  predniSONE (DELTASONE) 50 MG tablet Take 1 tablet (50 mg total) by mouth daily for 5 days. 08/03/20 08/08/20  Jamale Spangler, PA-C    Allergies    Penicillins  Review of Systems   Review of Systems  Respiratory: Positive for cough, chest tightness, shortness of breath and wheezing.   All other systems reviewed and are negative.   Physical Exam Updated Vital Signs BP 131/90 (BP Location: Left Wrist)   Pulse (!) 103   Temp 98.2 F (36.8 C) (Oral)   Resp (!) 26   SpO2 95%   Physical Exam Vitals and nursing note reviewed.  Constitutional:      General: He is not in acute distress.    Appearance: He is well-developed.     Comments: Appears mildly short of breath, otherwise nontoxic  HENT:     Head: Normocephalic and atraumatic.  Eyes:     Conjunctiva/sclera: Conjunctivae normal.     Pupils: Pupils are equal, round,  and reactive to light.  Cardiovascular:     Rate and Rhythm: Normal rate and regular rhythm.     Pulses: Normal pulses.  Pulmonary:     Effort: Pulmonary effort is normal. No respiratory distress.     Breath sounds: Wheezing present.     Comments: Significant inspiratory and expiratory wheezing in all fields.  Speaking in short, 3-4 word sentences.  Sats stable on room air. Abdominal:     General: There is no distension.     Palpations: Abdomen is soft. There is no mass.     Tenderness: There is no abdominal tenderness. There is no guarding or rebound.  Musculoskeletal:        General: Normal range of motion.     Cervical back: Normal range of motion  and neck supple.     Right lower leg: No edema.     Left lower leg: No edema.  Skin:    General: Skin is warm and dry.     Capillary Refill: Capillary refill takes less than 2 seconds.  Neurological:     Mental Status: He is alert and oriented to person, place, and time.     ED Results / Procedures / Treatments   Labs (all labs ordered are listed, but only abnormal results are displayed) Labs Reviewed  BASIC METABOLIC PANEL - Abnormal; Notable for the following components:      Result Value   Glucose, Bld 109 (*)    All other components within normal limits  RESPIRATORY PANEL BY RT PCR (FLU A&B, COVID)  CBC    EKG None  Radiology No results found.  Procedures .Critical Care Performed by: Alveria Apley, PA-C Authorized by: Alveria Apley, PA-C   Critical care provider statement:    Critical care time (minutes):  35   Critical care time was exclusive of:  Separately billable procedures and treating other patients and teaching time   Critical care was necessary to treat or prevent imminent or life-threatening deterioration of the following conditions:  Respiratory failure   Critical care was time spent personally by me on the following activities:  Blood draw for specimens, development of treatment plan with patient or surrogate, evaluation of patient's response to treatment, examination of patient, obtaining history from patient or surrogate, ordering and performing treatments and interventions, ordering and review of laboratory studies, ordering and review of radiographic studies, pulse oximetry, re-evaluation of patient's condition and review of old charts   I assumed direction of critical care for this patient from another provider in my specialty: no   Comments:     Pt wheezing after multiple albuterol nebs, required hour-long continuous nebs with frequent reassessment.    (including critical care time)  Medications Ordered in ED Medications  albuterol  (PROVENTIL,VENTOLIN) solution continuous neb (0 mg/hr Nebulization Stopped 08/02/20 2100)  albuterol (PROVENTIL,VENTOLIN) solution continuous neb (0 mg/hr Nebulization Stopped 08/02/20 2222)  albuterol (PROVENTIL) (2.5 MG/3ML) 0.083% nebulizer solution (2.5 mg  Given 08/02/20 1914)  ipratropium-albuterol (DUONEB) 0.5-2.5 (3) MG/3ML nebulizer solution (3 mLs  Given 08/02/20 1914)  albuterol (PROVENTIL) (2.5 MG/3ML) 0.083% nebulizer solution (5 mg  Given 08/02/20 1931)  magnesium sulfate IVPB 2 g 50 mL ( Intravenous Stopped 08/02/20 2112)  methylPREDNISolone sodium succinate (SOLU-MEDROL) 125 mg/2 mL injection 125 mg (125 mg Intravenous Given 08/02/20 2010)  ipratropium (ATROVENT) nebulizer solution 0.5 mg (0.5 mg Nebulization Given 08/02/20 1957)    ED Course  I have reviewed the triage vital signs and the nursing notes.  Pertinent labs &  imaging results that were available during my care of the patient were reviewed by me and considered in my medical decision making (see chart for details).    MDM Rules/Calculators/A&P                          Patient presenting for evaluation of 1 week history of gradually worsening shortness of breath, wheezing, chest tightness.  On exam, patient with significant inspiratory and expiratory wheezing in all fields.  He is speaking in short, 3-4 word sentences.  I saw patient after he had already received 3 nebulized treatments up in triage.  Considering his continued poor respiratory status, he will need an hour-long neb, mag, Solu-Medrol.  Will obtain labs and Covid test and continue to assess.  Case discussed with attending, Dr. Clarene Duke evaluated the patient.  Halfway through continuous neb, patient reports mild improvement of symptoms.  He continues to have wheezing.  Sats remained stable.  Labs interpreted by me, overall reassuring.  Covid test is negative.  After hour-long neb, patient reports improvement of symptoms.  He is speaking in full sentences at this  time.  Sats are stable.  He continues to have scattered expiratory wheezing, but improved from previous.  Patient feels he is safe to go home at this time.  Will place on prednisone and have patient use his inhaler regularly.  Encourage close follow-up with his PCP.  At this time, patient appears safe for discharge.  Return precautions given.  Patient states he understands and agrees to plan.  Final Clinical Impression(s) / ED Diagnoses Final diagnoses:  Exacerbation of asthma, unspecified asthma severity, unspecified whether persistent    Rx / DC Orders ED Discharge Orders         Ordered    predniSONE (DELTASONE) 50 MG tablet  Daily        08/02/20 2229           Alveria Apley, PA-C 08/03/20 1539    Little, Ambrose Finland, MD 08/03/20 2252

## 2020-12-08 ENCOUNTER — Encounter (HOSPITAL_BASED_OUTPATIENT_CLINIC_OR_DEPARTMENT_OTHER): Payer: Self-pay

## 2020-12-08 ENCOUNTER — Other Ambulatory Visit: Payer: Self-pay

## 2020-12-08 ENCOUNTER — Emergency Department (HOSPITAL_BASED_OUTPATIENT_CLINIC_OR_DEPARTMENT_OTHER)
Admission: EM | Admit: 2020-12-08 | Discharge: 2020-12-08 | Disposition: A | Discharge status: Home / Self Care | Payer: BLUE CROSS/BLUE SHIELD | Attending: Emergency Medicine | Admitting: Emergency Medicine

## 2020-12-08 DIAGNOSIS — J45909 Unspecified asthma, uncomplicated: Secondary | ICD-10-CM | POA: Insufficient documentation

## 2020-12-08 DIAGNOSIS — S46812A Strain of other muscles, fascia and tendons at shoulder and upper arm level, left arm, initial encounter: Secondary | ICD-10-CM

## 2020-12-08 DIAGNOSIS — M542 Cervicalgia: Secondary | ICD-10-CM | POA: Insufficient documentation

## 2020-12-08 DIAGNOSIS — I10 Essential (primary) hypertension: Secondary | ICD-10-CM | POA: Insufficient documentation

## 2020-12-08 DIAGNOSIS — Z79899 Other long term (current) drug therapy: Secondary | ICD-10-CM | POA: Insufficient documentation

## 2020-12-08 DIAGNOSIS — Z87891 Personal history of nicotine dependence: Secondary | ICD-10-CM | POA: Insufficient documentation

## 2020-12-08 MED ORDER — CYCLOBENZAPRINE HCL 10 MG PO TABS: 5.0000 mg | ORAL_TABLET | Freq: Two times a day (BID) | ORAL | 0 refills | Status: DC | PRN

## 2020-12-08 MED ORDER — CELECOXIB 200 MG PO CAPS: 200.0000 mg | ORAL_CAPSULE | Freq: Two times a day (BID) | ORAL | 0 refills | Status: DC

## 2020-12-08 MED ORDER — ALBUTEROL SULFATE HFA 108 (90 BASE) MCG/ACT IN AERS: 1.0000 | INHALATION_SPRAY | RESPIRATORY_TRACT | 2 refills | Status: AC | PRN

## 2020-12-08 NOTE — Discharge Instructions (Addendum)
You appear to have a levator scapula strain injury. There are many self care videos available online for this particular injury. Please follow up as soon as possible with your primary care doctor.  Return to emergency department if you have any severe or uncontrolled pain with your medication, new numbness or weakness of the left arm.

## 2020-12-08 NOTE — ED Provider Notes (Signed)
[facility] EMERGENCY DEPARTMENT Provider Note   CSN: [id number] Arrival date & time: 12/08/20  1510     History Chief Complaint  Patient presents with  . Neck Pain    Adam May is a 50 y.o. male who presents emergency department chief complaint of left neck pain.  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  He noticed about a week ago that he started having pain on the left side of his neck which is worse when he lets his arm hang down and better when he raises his arm above his head and relaxes it.  He has been using Tylenol Motrin without much relief.  He denies any pain with movement of the neck except for shrugging the shoulder up toward the ear.  He denies any numbness or tingling of the extremities.  Patient also states that he needs a refill on his albuterol because he is run out.  He does not have any active asthma symptoms at this time.  HPI     Past Medical History:  Diagnosis Date  . Asthma   . Hemorrhoids   . Hypertension     There are no problems to display for this patient.   History reviewed. No pertinent surgical history.     No family history on file.  Social History   Tobacco Use  . Smoking status: Former Smoker    Types: Cigars    Quit date: 12/19/2015    Years since quitting: 4.9  . Smokeless tobacco: Never Used  Vaping Use  . Vaping Use: Never used  Substance Use Topics  . Alcohol use: No  . Drug use: No    Home Medications Prior to Admission medications   Medication Sig Start Date End Date Taking? Authorizing Provider  albuterol (PROVENTIL) (2.5 MG/3ML) 0.083% nebulizer solution Inhale into the lungs. 05/04/20  Yes [provider]  albuterol (VENTOLIN HFA) 108 (90 Base) MCG/ACT inhaler Inhale into the lungs. 06/02/20  Yes [provider]  fluticasone (FLOVENT HFA) 110 MCG/ACT inhaler Inhale 2 puffs into the lungs 2 (two) times daily. 01/09/20   Yes Jacalyn Lefevre, MD  albuterol (VENTOLIN HFA) 108 (90 Base) MCG/ACT inhaler INHALE 2 PUFFS INTO THE LUNGS Q 4 H PRN FOR WHEEZING 05/12/19   [provider]  ibuprofen (ADVIL) 600 MG tablet Take 1 tablet (600 mg total) by mouth every 6 (six) hours as needed. 01/09/20   Jacalyn Lefevre, MD    Allergies    Penicillins  Review of Systems   Review of Systems Ten systems reviewed and are negative for acute change, except as noted in the HPI.   Physical Exam Updated Vital Signs BP (!) 134/101 (BP Location: Left Arm)   Pulse (!) 106   Temp 98.2 F (36.8 C) (Oral)   Resp 20   Ht 6\' 3"  (1.905 m)   Wt 93 kg   SpO2 97%   BMI 25.62 kg/m   Physical Exam Vitals and nursing note reviewed.  Constitutional:      General: He is not in acute distress.    Appearance: He is well-developed and well-nourished. He is not diaphoretic.  HENT:     Head: Normocephalic and atraumatic.  Eyes:     General: No scleral icterus.    Conjunctiva/sclera: Conjunctivae normal.  Cardiovascular:     Rate and Rhythm: Normal rate and regular rhythm.     Heart sounds: Normal heart  sounds.  Pulmonary:     Effort: Pulmonary effort is normal. No respiratory distress.     Breath sounds: Normal breath sounds.  Abdominal:     Palpations: Abdomen is soft.     Tenderness: There is no abdominal tenderness.  Musculoskeletal:        General: No edema.     Cervical back: Normal range of motion and neck supple.     Comments: Equal bilateral grip strength and sensation.  Tenderness along the left levator scapula made worse with elevation of the left scapula.  Normal range of motion of the neck without significant pain.  No midline tenderness.  Skin:    General: Skin is warm and dry.  Neurological:     Mental Status: He is alert.  Psychiatric:        Behavior: Behavior normal.     ED Results / Procedures / Treatments   Labs (all labs ordered are listed, but only abnormal results are displayed) Labs Reviewed  - No data to display  EKG None  Radiology No results found.  Procedures Procedures   Medications Ordered in ED Medications - No data to display  ED Course  I have reviewed the triage vital signs and the nursing notes.  Pertinent labs & imaging results that were available during my care of the patient were reviewed by me and considered in my medical decision making (see chart for details).    MDM Rules/Calculators/A&P                          Patient here with left-sided neck pain consistent with levator scapula strain.  Patient will be given anti-inflammatory medication and muscle relaxer.  No evidence of radiculopathy or myelopathy.  Discussed outpatient follow-up and return precautions.   Final Clinical Impression(s) / ED Diagnoses Final diagnoses:  None    Rx / DC Orders ED Discharge Orders    None       Arthor Captain, PA-C 12/08/20 2010    Benjiman Core, MD 12/08/20 [phone]

## 2020-12-08 NOTE — ED Triage Notes (Addendum)
Pt c/o pain left side of neck x 1 week-pain worse when he moves-states pain is better when he lifts left UE above head-denies injury-NAD-steady gait

## 2020-12-08 NOTE — ED Notes (Signed)
Pt has complaints of past neck pain. Patient took tylenol 2 hours ago and pain is 0 at this time. No limited movement.

## 2021-08-28 ENCOUNTER — Encounter (HOSPITAL_BASED_OUTPATIENT_CLINIC_OR_DEPARTMENT_OTHER): Payer: Self-pay | Admitting: Emergency Medicine

## 2021-08-28 ENCOUNTER — Emergency Department (HOSPITAL_BASED_OUTPATIENT_CLINIC_OR_DEPARTMENT_OTHER)
Admission: EM | Admit: 2021-08-28 | Discharge: 2021-08-28 | Disposition: A | Discharge status: Home / Self Care | Payer: BLUE CROSS/BLUE SHIELD | Attending: Emergency Medicine | Admitting: Emergency Medicine

## 2021-08-28 ENCOUNTER — Other Ambulatory Visit: Payer: Self-pay

## 2021-08-28 DIAGNOSIS — L509 Urticaria, unspecified: Secondary | ICD-10-CM | POA: Insufficient documentation

## 2021-08-28 DIAGNOSIS — I10 Essential (primary) hypertension: Secondary | ICD-10-CM | POA: Insufficient documentation

## 2021-08-28 DIAGNOSIS — Z87891 Personal history of nicotine dependence: Secondary | ICD-10-CM | POA: Diagnosis not present

## 2021-08-28 DIAGNOSIS — Z79899 Other long term (current) drug therapy: Secondary | ICD-10-CM | POA: Diagnosis not present

## 2021-08-28 DIAGNOSIS — Z7951 Long term (current) use of inhaled steroids: Secondary | ICD-10-CM | POA: Diagnosis not present

## 2021-08-28 DIAGNOSIS — J45909 Unspecified asthma, uncomplicated: Secondary | ICD-10-CM | POA: Insufficient documentation

## 2021-08-28 DIAGNOSIS — X58XXXA Exposure to other specified factors, initial encounter: Secondary | ICD-10-CM | POA: Insufficient documentation

## 2021-08-28 DIAGNOSIS — T7840XA Allergy, unspecified, initial encounter: Secondary | ICD-10-CM | POA: Insufficient documentation

## 2021-08-28 DIAGNOSIS — T782XXA Anaphylactic shock, unspecified, initial encounter: Secondary | ICD-10-CM

## 2021-08-28 MED ORDER — EPINEPHRINE 0.3 MG/0.3ML IJ SOAJ: 0.3000 mg | INTRAMUSCULAR | 0 refills | Status: AC | PRN

## 2021-08-28 MED ORDER — SODIUM CHLORIDE 0.9 % IV SOLN: INTRAVENOUS | Status: DC | PRN

## 2021-08-28 MED ORDER — EPINEPHRINE 0.3 MG/0.3ML IJ SOAJ
INTRAMUSCULAR | Status: AC
  Filled 2021-08-28: qty 0.3

## 2021-08-28 MED ORDER — FAMOTIDINE IN NACL 20-0.9 MG/50ML-% IV SOLN
20.0000 mg | Freq: Once | INTRAVENOUS | Status: AC
  Administered 2021-08-28: 20 mg via INTRAVENOUS
  Filled 2021-08-28: qty 50

## 2021-08-28 MED ORDER — EPINEPHRINE 0.3 MG/0.3ML IJ SOAJ
0.3000 mg | Freq: Once | INTRAMUSCULAR | Status: AC
  Administered 2021-08-28: 0.3 mg via INTRAMUSCULAR

## 2021-08-28 MED ORDER — DIPHENHYDRAMINE HCL 50 MG/ML IJ SOLN
25.0000 mg | Freq: Once | INTRAMUSCULAR | Status: AC
  Administered 2021-08-28: 25 mg via INTRAVENOUS
  Filled 2021-08-28: qty 1

## 2021-08-28 MED ORDER — PREDNISONE 20 MG PO TABS: ORAL_TABLET | ORAL | 0 refills | Status: DC

## 2021-08-28 MED ORDER — METHYLPREDNISOLONE SODIUM SUCC 125 MG IJ SOLR
125.0000 mg | Freq: Once | INTRAMUSCULAR | Status: AC
  Administered 2021-08-28: 125 mg via INTRAVENOUS
  Filled 2021-08-28: qty 2

## 2021-08-28 NOTE — ED Notes (Signed)
ED Provider at bedside. 

## 2021-08-28 NOTE — ED Triage Notes (Signed)
Pt c/o allergic reaction to unknown substance/irritant x 2 days. Pt tried benadryl with temporary relief. Pt has hives all over body and swelling to left side of lip. Pt reports tightness in throat today. Pt talking in complete sentences.

## 2021-08-28 NOTE — ED Provider Notes (Signed)
[facility] EMERGENCY DEPARTMENT Provider Note   CSN: [id number] Arrival date & time: 08/28/21  2993     History Chief Complaint  Patient presents with   Allergic Reaction    Adam May is a 50 y.o. male.  Patient is a 50 year old male who presents with allergic reaction.  He says about 2 days ago he started having a rash/hives all over.  He used some Benadryl with some relief.  This morning they came back he says with a vengeance.  He also started having some swelling of his lip this morning and now he feels like the back of his throat is swollen and its hard to breathe.  He says his voice is changed.  He denies any known history of allergic reactions in the past.  No new exposures.  Of note he is on lisinopril which he says he has been on for years.      Past Medical History:  Diagnosis Date   Asthma    Hemorrhoids    Hypertension     There are no problems to display for this patient.   History reviewed. No pertinent surgical history.     No family history on file.  Social History   Tobacco Use   Smoking status: Former    Types: Cigars    Quit date: 12/19/2015    Years since quitting: 5.6   Smokeless tobacco: Never  Vaping Use   Vaping Use: Never used  Substance Use Topics   Alcohol use: No   Drug use: No    Home Medications Prior to Admission medications   Medication Sig Start Date End Date Taking? Authorizing Provider  albuterol (VENTOLIN HFA) 108 (90 Base) MCG/ACT inhaler Inhale into the lungs. 06/02/20  Yes [provider]  celecoxib (CELEBREX) 200 MG capsule Take 1 capsule (200 mg total) by mouth 2 (two) times daily. 12/08/20  Yes Arthor Captain, PA-C  EPINEPHrine 0.3 mg/0.3 mL IJ SOAJ injection Inject 0.3 mg into the muscle as needed for anaphylaxis. 08/28/21  Yes Rolan Bucco, MD  lisinopril (ZESTRIL) 20 MG tablet Take 20 mg by mouth daily.   Yes [provider]  predniSONE (DELTASONE) 20 MG tablet 2 tabs po daily x 4  days 08/28/21  Yes Rolan Bucco, MD  albuterol (PROVENTIL) (2.5 MG/3ML) 0.083% nebulizer solution Inhale into the lungs. 05/04/20   [provider]  albuterol (VENTOLIN HFA) 108 (90 Base) MCG/ACT inhaler INHALE 2 PUFFS INTO THE LUNGS Q 4 H PRN FOR WHEEZING 05/12/19   [provider]  albuterol (VENTOLIN HFA) 108 (90 Base) MCG/ACT inhaler Inhale 1-2 puffs into the lungs every 4 (four) hours as needed for wheezing or shortness of breath. 12/08/20   Arthor Captain, PA-C  cyclobenzaprine (FLEXERIL) 10 MG tablet Take 0.5-1 tablets (5-10 mg total) by mouth 2 (two) times daily as needed for muscle spasms. 12/08/20   Arthor Captain, PA-C  fluticasone (FLOVENT HFA) 110 MCG/ACT inhaler Inhale 2 puffs into the lungs 2 (two) times daily. 01/09/20   Jacalyn Lefevre, MD  ibuprofen (ADVIL) 600 MG tablet Take 1 tablet (600 mg total) by mouth every 6 (six) hours as needed. 01/09/20   Jacalyn Lefevre, MD    Allergies    Penicillins  Review of Systems   Review of Systems  Constitutional:  Negative for chills, diaphoresis, fatigue and fever.  HENT:  Positive for facial swelling, sore throat, trouble swallowing and voice change. Negative for congestion, rhinorrhea and sneezing.   Eyes: Negative.   Respiratory:  Negative for cough, chest tightness and shortness of breath.   Cardiovascular:  Negative for chest pain and leg swelling.  Gastrointestinal:  Negative for abdominal pain, blood in stool, diarrhea, nausea and vomiting.  Genitourinary:  Negative for difficulty urinating, flank pain, frequency and hematuria.  Musculoskeletal:  Negative for arthralgias and back pain.  Skin:  Positive for rash.  Neurological:  Negative for dizziness, speech difficulty, weakness, numbness and headaches.   Physical Exam Updated Vital Signs BP 121/88   Pulse 74   Temp 98.4 F (36.9 C) (Oral)   Resp 16   Ht 6' 3.5" (1.918 m)   Wt 90.7 kg   SpO2 100%   BMI 24.67 kg/m   Physical Exam Constitutional:       Appearance: He is well-developed.  HENT:     Head: Normocephalic and atraumatic.     Mouth/Throat:     Comments: Swelling to the lower lip, mildly hoarse voice.  No stridor no difficulty controlling his secretions Eyes:     Pupils: Pupils are equal, round, and reactive to light.  Cardiovascular:     Rate and Rhythm: Normal rate and regular rhythm.     Heart sounds: Normal heart sounds.  Pulmonary:     Effort: Pulmonary effort is normal. No respiratory distress.     Breath sounds: Normal breath sounds. No stridor. No wheezing or rales.  Chest:     Chest wall: No tenderness.  Abdominal:     General: Bowel sounds are normal.     Palpations: Abdomen is soft.     Tenderness: There is no abdominal tenderness. There is no guarding or rebound.  Musculoskeletal:        General: Normal range of motion.     Cervical back: Normal range of motion and neck supple.  Lymphadenopathy:     Cervical: No cervical adenopathy.  Skin:    General: Skin is warm and dry.     Findings: Rash (Diffuse urticarial rash) present.  Neurological:     Mental Status: He is alert and oriented to person, place, and time.    ED Results / Procedures / Treatments   Labs (all labs ordered are listed, but only abnormal results are displayed) Labs Reviewed - No data to display  EKG None  Radiology No results found.  Procedures Procedures   Medications Ordered in ED Medications  0.9 %  sodium chloride infusion (0 mLs Intravenous Stopped 08/28/21 1303)  EPINEPHrine (EPI-PEN) injection 0.3 mg (0.3 mg Intramuscular Given 08/28/21 1009)  diphenhydrAMINE (BENADRYL) injection 25 mg (25 mg Intravenous Given 08/28/21 1018)  methylPREDNISolone sodium succinate (SOLU-MEDROL) 125 mg/2 mL injection 125 mg (125 mg Intravenous Given 08/28/21 1018)  famotidine (PEPCID) IVPB 20 mg premix (0 mg Intravenous Stopped 08/28/21 1148)    ED Course  I have reviewed the triage vital signs and the nursing notes.  Pertinent labs &  imaging results that were available during my care of the patient were reviewed by me and considered in my medical decision making (see chart for details).    MDM Rules/Calculators/A&P                           Patient is a 50 year old male who presents with allergic reaction.  He had angioedema present.  He was given epinephrine, Solu-Medrol and Benadryl as well as Pepcid.  He was monitored for several hours and had ongoing improving symptoms.  On discharge, his voice had normalized.  He had  no trouble swallowing and his swelling had almost resolved.  He is on lisinopril which could have contributed to his angioedema.  I advised him to stop the lisinopril.  It looks as if on his prior records from his PCP, he is already on amlodipine and hydrochlorothiazide.  We will have him continue these medications and follow-up with his PCP to see if he needs to be restarted on another blood pressure medicine.  He was given a 4-day course of prednisone to start tomorrow as well as an EpiPen should he need it.  He was given instructions on its use.  He was advised to make an appointment for follow-up with his PCP.  Return precautions were given.  CRITICAL CARE Performed by: Rolan Bucco Total critical care time: 70 minutes Critical care time was exclusive of separately billable procedures and treating other patients. Critical care was necessary to treat or prevent imminent or life-threatening deterioration. Critical care was time spent personally by me on the following activities: development of treatment plan with patient and/or surrogate as well as nursing, discussions with consultants, evaluation of patient's response to treatment, examination of patient, obtaining history from patient or surrogate, ordering and performing treatments and interventions, ordering and review of laboratory studies, ordering and review of radiographic studies, pulse oximetry and re-evaluation of patient's condition.  Final  Clinical Impression(s) / ED Diagnoses Final diagnoses:  Anaphylaxis, initial encounter    Rx / DC Orders ED Discharge Orders          Ordered    predniSONE (DELTASONE) 20 MG tablet        08/28/21 1438    EPINEPHrine 0.3 mg/0.3 mL IJ SOAJ injection  As needed        08/28/21 1438             Rolan Bucco, MD 08/28/21 1440

## 2021-08-28 NOTE — Discharge Instructions (Addendum)
Stop taking your lisinopril.  Continue taking your other blood pressure medications.  Follow-up with your primary care doctor to recheck your blood pressure and see if you can just continue the 1 you are on.  Return here as needed if you have any worsening symptoms.

## 2022-01-22 IMAGING — CR DG CHEST 2V
2 series · 2 of 2 positions shown · non-contrast
Comparison: Single-view of the chest 10/19/2019. PA and lateral
chest 05/05/2019

CLINICAL DATA: Inferior left chest pain since a coughing episode
this morning.

EXAM:
CHEST - 2 VIEW

[w chest pa]
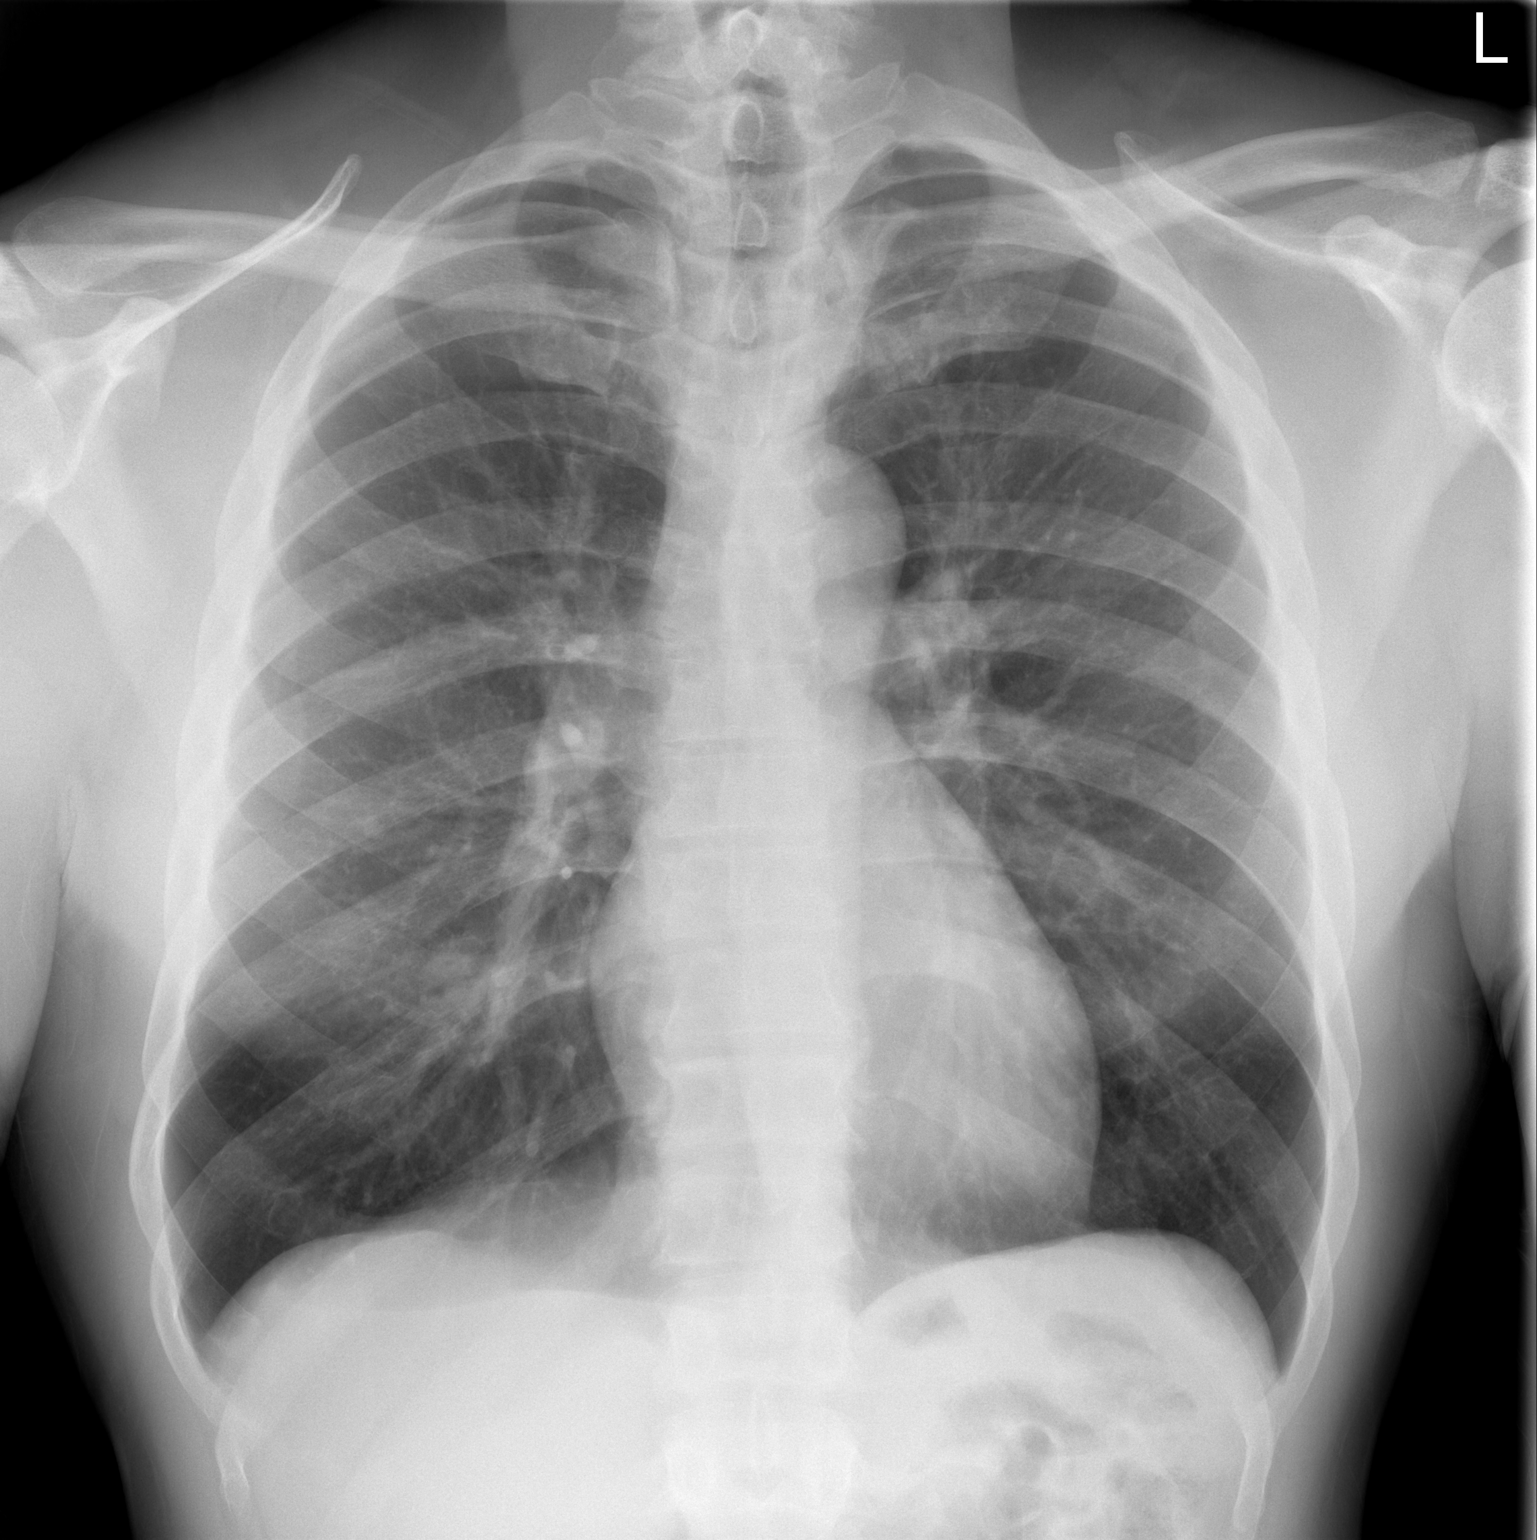

[w chest lat]
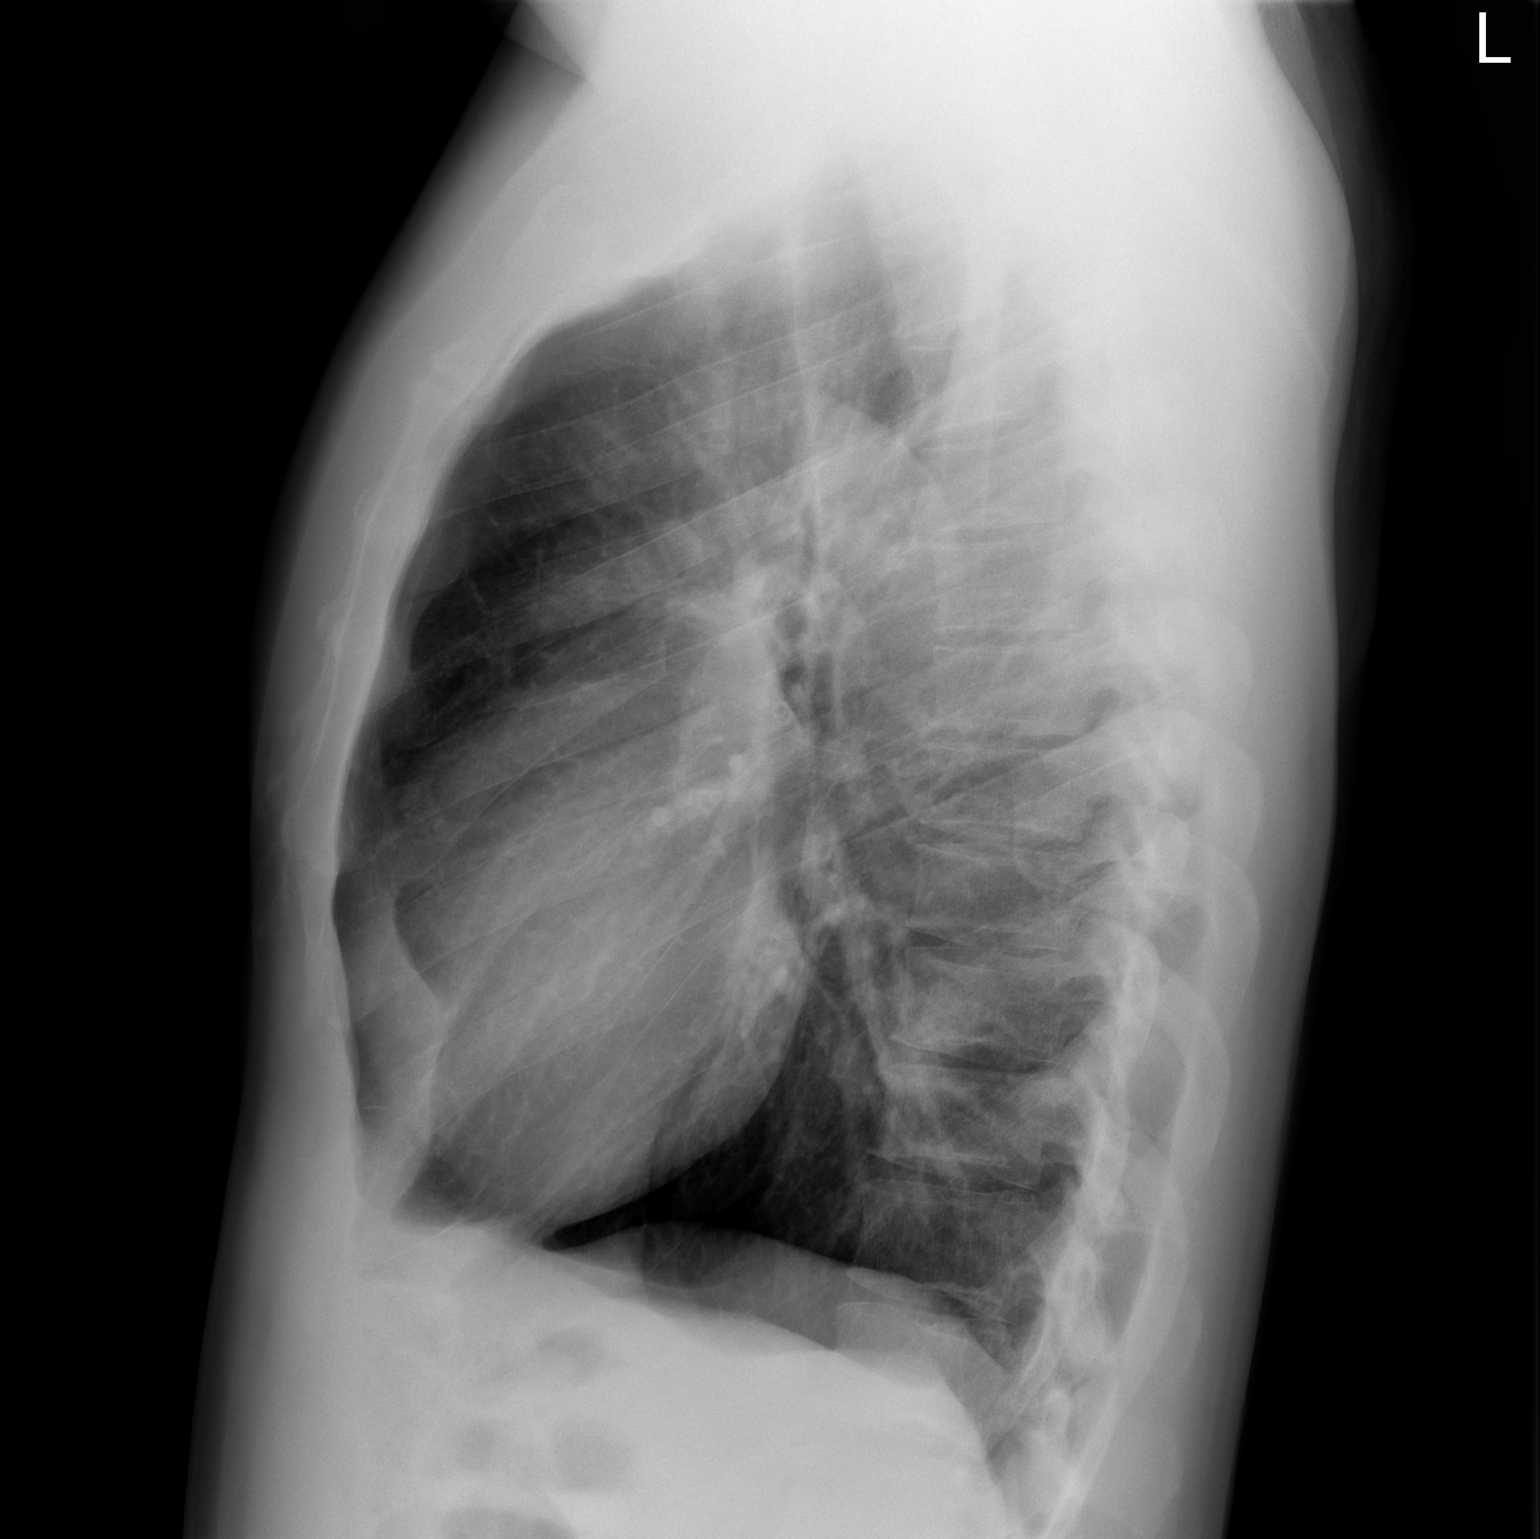

[2 of 2 positions shown; findings below may reference images not displayed]

FINDINGS: Lungs clear. No pneumothorax or pleural fluid. Heart size normal. No
bony abnormality.
IMPRESSION: Normal chest.

## 2022-08-06 ENCOUNTER — Encounter (HOSPITAL_BASED_OUTPATIENT_CLINIC_OR_DEPARTMENT_OTHER): Payer: Self-pay | Admitting: Emergency Medicine

## 2022-08-06 ENCOUNTER — Other Ambulatory Visit: Payer: Self-pay

## 2022-08-06 ENCOUNTER — Emergency Department (HOSPITAL_BASED_OUTPATIENT_CLINIC_OR_DEPARTMENT_OTHER)
Admission: EM | Admit: 2022-08-06 | Discharge: 2022-08-06 | Disposition: A | Discharge status: Home / Self Care | Payer: BLUE CROSS/BLUE SHIELD | Attending: Emergency Medicine | Admitting: Emergency Medicine

## 2022-08-06 DIAGNOSIS — J45909 Unspecified asthma, uncomplicated: Secondary | ICD-10-CM | POA: Diagnosis not present

## 2022-08-06 DIAGNOSIS — M542 Cervicalgia: Secondary | ICD-10-CM | POA: Diagnosis present

## 2022-08-06 DIAGNOSIS — Z79899 Other long term (current) drug therapy: Secondary | ICD-10-CM | POA: Insufficient documentation

## 2022-08-06 DIAGNOSIS — M5412 Radiculopathy, cervical region: Secondary | ICD-10-CM | POA: Diagnosis not present

## 2022-08-06 DIAGNOSIS — I10 Essential (primary) hypertension: Secondary | ICD-10-CM | POA: Diagnosis not present

## 2022-08-06 DIAGNOSIS — Z7951 Long term (current) use of inhaled steroids: Secondary | ICD-10-CM | POA: Diagnosis not present

## 2022-08-06 MED ORDER — CYCLOBENZAPRINE HCL 10 MG PO TABS: 10.0000 mg | ORAL_TABLET | Freq: Two times a day (BID) | ORAL | 0 refills | Status: AC | PRN

## 2022-08-06 MED ORDER — NAPROXEN 375 MG PO TABS: 375.0000 mg | ORAL_TABLET | Freq: Two times a day (BID) | ORAL | 0 refills | Status: AC

## 2022-08-06 MED ORDER — PREDNISONE 50 MG PO TABS: 50.0000 mg | ORAL_TABLET | Freq: Every day | ORAL | 0 refills | Status: AC

## 2022-08-06 NOTE — ED Triage Notes (Signed)
Pt reports pinched nerve on L side of neck that causes pain to shoulder for the past few days. Hx of the same and feels the same. Pain worsens with movement. Full ROM.

## 2022-08-06 NOTE — ED Provider Notes (Signed)
[facility] Provider Note   CSN: [id number] Arrival date & time: 08/06/22  [id number]     History  Chief Complaint  Patient presents with   Shoulder Pain    Adam May is a 51 y.o. male.   Shoulder Pain    Patient has a history of asthma and hypertension who presents to the ED with complaints of right shoulder neck pain.  Patient states he has a history of having trouble with pinched nerve in his neck.  He feels like he is having a similar exacerbation.  Patient states this is not a constant issue for him he had an episode a while back which resolved.  In the last week however he has had some increasing pain and discomfort.  He noticed it when he woke up other day.  He denies any recent falls or injuries.  He is not having any numbness or weakness.  Pain is in his left neck and goes into his shoulder and upper arm.  It does change with positions of his arm.  Home Medications Prior to Admission medications   Medication Sig Start Date End Date Taking? Authorizing Provider  cyclobenzaprine (FLEXERIL) 10 MG tablet Take 1 tablet (10 mg total) by mouth 2 (two) times daily as needed for muscle spasms. 08/06/22  Yes Dorie Rank, MD  naproxen (NAPROSYN) 375 MG tablet Take 1 tablet (375 mg total) by mouth 2 (two) times daily. 08/06/22  Yes Dorie Rank, MD  predniSONE (DELTASONE) 50 MG tablet Take 1 tablet (50 mg total) by mouth daily. 08/06/22  Yes Dorie Rank, MD  albuterol (PROVENTIL) (2.5 MG/3ML) 0.083% nebulizer solution Inhale into the lungs. 05/04/20   [provider]  albuterol (VENTOLIN HFA) 108 (90 Base) MCG/ACT inhaler INHALE 2 PUFFS INTO THE LUNGS Q 4 H PRN FOR WHEEZING 05/12/19   [provider]  albuterol (VENTOLIN HFA) 108 (90 Base) MCG/ACT inhaler Inhale into the lungs. 06/02/20   [provider]  albuterol (VENTOLIN HFA) 108 (90 Base) MCG/ACT inhaler Inhale 1-2 puffs into the lungs every 4 (four) hours as needed for wheezing or  shortness of breath. 12/08/20   Margarita Mail, PA-C  EPINEPHrine 0.3 mg/0.3 mL IJ SOAJ injection Inject 0.3 mg into the muscle as needed for anaphylaxis. 08/28/21   Malvin Johns, MD  fluticasone (FLOVENT HFA) 110 MCG/ACT inhaler Inhale 2 puffs into the lungs 2 (two) times daily. 01/09/20   Isla Pence, MD  ibuprofen (ADVIL) 600 MG tablet Take 1 tablet (600 mg total) by mouth every 6 (six) hours as needed. 01/09/20   Isla Pence, MD  lisinopril (ZESTRIL) 20 MG tablet Take 20 mg by mouth daily.    [provider]      Allergies    Penicillins    Review of Systems   Review of Systems  Physical Exam Updated Vital Signs BP (!) 155/94   Pulse (!) 58   Temp 97.9 F (36.6 C) (Oral)   Resp 16   SpO2 96%  Physical Exam Vitals and nursing note reviewed.  Constitutional:      General: He is not in acute distress.    Appearance: He is well-developed.  HENT:     Head: Normocephalic and atraumatic.     Right Ear: External ear normal.     Left Ear: External ear normal.  Eyes:     General: No scleral icterus.       Right eye: No discharge.        Left eye:  No discharge.     Conjunctiva/sclera: Conjunctivae normal.  Neck:     Trachea: No tracheal deviation.  Cardiovascular:     Rate and Rhythm: Normal rate.  Pulmonary:     Effort: Pulmonary effort is normal. No respiratory distress.     Breath sounds: No stridor.  Abdominal:     General: There is no distension.  Musculoskeletal:        General: Tenderness present. No swelling or deformity.     Cervical back: Neck supple.     Comments: Strong radial pulse left wrist mild tenderness palpation trapezius muscle left side, no swelling noted to the shoulder, no deformity, able to range without discomfort  Skin:    General: Skin is warm and dry.     Findings: No rash.  Neurological:     General: No focal deficit present.     Mental Status: He is alert.     Cranial Nerves: Cranial nerve deficit: no gross deficits.      Comments: Normal strength and sensation bilateral upper extremities, no facial droop     ED Results / Procedures / Treatments   Labs (all labs ordered are listed, but only abnormal results are displayed) Labs Reviewed - No data to display  EKG None  Radiology No results found.  Procedures Procedures    Medications Ordered in ED Medications - No data to display  ED Course/ Medical Decision Making/ A&P                           Medical Decision Making Risk Prescription drug management.   Exam suggestive of possible cervical radiculopathy versus rotator cuff syndrome.  No findings to suggest infection.  No findings to suggest acute vascular compromise.  Patient appears comfortable and has mild tenderness on exam.  He has had trouble with cervical radiculopathy in the past.  We will try a course of steroids and side muscle accident.  Discussed outpatient follow-up with primary care doctor or orthopedic doctor or spine doctor if symptoms do not improve        Final Clinical Impression(s) / ED Diagnoses Final diagnoses:  Cervical radiculopathy    Rx / DC Orders ED Discharge Orders          Ordered    predniSONE (DELTASONE) 50 MG tablet  Daily        08/06/22 0844    cyclobenzaprine (FLEXERIL) 10 MG tablet  2 times daily PRN        08/06/22 0844    naproxen (NAPROSYN) 375 MG tablet  2 times daily        08/06/22 0844              Dorie Rank, MD 08/06/22 [phone]

## 2022-08-06 NOTE — Discharge Instructions (Signed)
Take the medications as prescribed to help with pain and discomfort.  Follow-up with an orthopedic, spine doctor or primary care doctor to be rechecked if the symptoms have not improved in the next week or so.
